# Patient Record
Sex: Male | Born: 1960 | Race: White | Hispanic: No | Marital: Married | Smoking: Never smoker
Health system: Southern US, Community
[De-identification: ages and names within clinical notes are randomized; demographics above are authoritative.]

## PROBLEM LIST (undated history)

## (undated) DIAGNOSIS — B182 Chronic viral hepatitis C: Secondary | ICD-10-CM

## (undated) DIAGNOSIS — E291 Testicular hypofunction: Secondary | ICD-10-CM

## (undated) DIAGNOSIS — G4700 Insomnia, unspecified: Secondary | ICD-10-CM

## (undated) DIAGNOSIS — J309 Allergic rhinitis, unspecified: Secondary | ICD-10-CM

## (undated) DIAGNOSIS — N400 Enlarged prostate without lower urinary tract symptoms: Secondary | ICD-10-CM

## (undated) DIAGNOSIS — N529 Male erectile dysfunction, unspecified: Secondary | ICD-10-CM

## (undated) DIAGNOSIS — I479 Paroxysmal tachycardia, unspecified: Secondary | ICD-10-CM

## (undated) DIAGNOSIS — E559 Vitamin D deficiency, unspecified: Secondary | ICD-10-CM

## (undated) DIAGNOSIS — I493 Ventricular premature depolarization: Secondary | ICD-10-CM

## (undated) HISTORY — DX: Male erectile dysfunction, unspecified: N52.9

## (undated) HISTORY — PX: SPLENECTOMY, TOTAL: SHX788

## (undated) HISTORY — DX: Allergic rhinitis, unspecified: J30.9

## (undated) HISTORY — DX: Paroxysmal tachycardia, unspecified: I47.9

## (undated) HISTORY — DX: Insomnia, unspecified: G47.00

## (undated) HISTORY — DX: Vitamin D deficiency, unspecified: E55.9

## (undated) HISTORY — DX: Ventricular premature depolarization: I49.3

## (undated) HISTORY — DX: Benign prostatic hyperplasia without lower urinary tract symptoms: N40.0

## (undated) HISTORY — DX: Testicular hypofunction: E29.1

## (undated) HISTORY — DX: Chronic viral hepatitis C: B18.2

---

## 2011-04-09 DIAGNOSIS — K754 Autoimmune hepatitis: Secondary | ICD-10-CM

## 2011-04-09 HISTORY — DX: Autoimmune hepatitis: K75.4

## 2012-10-08 DIAGNOSIS — T148XXA Other injury of unspecified body region, initial encounter: Secondary | ICD-10-CM

## 2012-10-08 DIAGNOSIS — Z9081 Acquired absence of spleen: Secondary | ICD-10-CM

## 2012-10-08 HISTORY — DX: Other injury of unspecified body region, initial encounter: T14.8XXA

## 2012-10-08 HISTORY — DX: Acquired absence of spleen: Z90.81

## 2019-08-03 ENCOUNTER — Encounter: Payer: Self-pay | Admitting: Cardiology

## 2019-08-03 ENCOUNTER — Other Ambulatory Visit: Payer: Self-pay

## 2019-08-03 ENCOUNTER — Ambulatory Visit: Payer: PRIVATE HEALTH INSURANCE | Admitting: Cardiology

## 2019-08-03 VITALS — BP 126/80 | HR 87 | Ht 73.0 in | Wt 215.8 lb

## 2019-08-03 DIAGNOSIS — D580 Hereditary spherocytosis: Secondary | ICD-10-CM

## 2019-08-03 DIAGNOSIS — D66 Hereditary factor VIII deficiency: Secondary | ICD-10-CM

## 2019-08-03 DIAGNOSIS — E785 Hyperlipidemia, unspecified: Secondary | ICD-10-CM

## 2019-08-03 DIAGNOSIS — I10 Essential (primary) hypertension: Secondary | ICD-10-CM | POA: Diagnosis not present

## 2019-08-03 DIAGNOSIS — I471 Supraventricular tachycardia, unspecified: Secondary | ICD-10-CM

## 2019-08-03 DIAGNOSIS — R0789 Other chest pain: Secondary | ICD-10-CM | POA: Insufficient documentation

## 2019-08-03 DIAGNOSIS — D751 Secondary polycythemia: Secondary | ICD-10-CM

## 2019-08-03 DIAGNOSIS — Z9081 Acquired absence of spleen: Secondary | ICD-10-CM

## 2019-08-03 DIAGNOSIS — E119 Type 2 diabetes mellitus without complications: Secondary | ICD-10-CM

## 2019-08-03 HISTORY — DX: Supraventricular tachycardia, unspecified: I47.10

## 2019-08-03 HISTORY — DX: Other chest pain: R07.89

## 2019-08-03 HISTORY — DX: Hyperlipidemia, unspecified: E78.5

## 2019-08-03 HISTORY — DX: Type 2 diabetes mellitus without complications: E11.9

## 2019-08-03 HISTORY — DX: Essential (primary) hypertension: I10

## 2019-08-03 HISTORY — DX: Hereditary factor VIII deficiency: D66

## 2019-08-03 HISTORY — DX: Hereditary spherocytosis: D58.0

## 2019-08-03 HISTORY — DX: Secondary polycythemia: D75.1

## 2019-08-03 HISTORY — DX: Supraventricular tachycardia: I47.1

## 2019-08-03 MED ORDER — METOPROLOL SUCCINATE ER 50 MG PO TB24
50.0000 mg | ORAL_TABLET | Freq: Every day | ORAL | 1 refills | Status: DC
Start: 2019-08-03 — End: 2020-01-27

## 2019-08-03 NOTE — Patient Instructions (Signed)
Medication Instructions:  Your physician has recommended you make the following change in your medication:   STOP: Metoprolol tartrate   START: Metoprolol succinate 50 mg daily  *If you need a refill on your cardiac medications before your next appointment, please call your pharmacy*   Lab Work: None.  If you have labs (blood work) drawn today and your tests are completely normal, you will receive your results only by: Marland Kitchen. MyChart Message (if you have MyChart) OR . A paper copy in the mail If you have any lab test that is abnormal or we need to change your treatment, we will call you to review the results.   Testing/Procedures: Your physician has requested that you have an echocardiogram. Echocardiography is a painless test that uses sound waves to create images of your heart. It provides your doctor with information about the size and shape of your heart and how well your heart's chambers and valves are working. This procedure takes approximately one hour. There are no restrictions for this procedure.    [facility] [address] [place], [place] [address] Phone:  [phone]    Please arrive 15 minutes prior to your appointment time for registration and insurance purposes.  The test will take approximately 3 to 4 hours to complete; you may bring reading material.  If someone comes with you to your appointment, they will need to remain in the main lobby due to limited space in the testing area. **If you are pregnant or breastfeeding, please notify the nuclear lab prior to your appointment**  How to prepare for your Myocardial Perfusion Test: . Do not eat or drink 3 hours prior to your test, except you may have water. . Do not consume products containing caffeine (regular or decaffeinated) 12 hours prior to your test. (ex: coffee, chocolate, sodas, tea). . Do bring a list of your current medications with you.  If not listed below, you may take your  medications as normal. . HOLD Erectile dysfunction medication: sildenafil 48 hours before, HOLD diabetic medication/insulin the morning of the test: farxiga, actos, and janumet,  . Do wear comfortable clothes (no dresses or overalls) and walking shoes, tennis shoes preferred (No heels or open toe shoes are allowed). . Do NOT wear cologne, perfume, aftershave, or lotions (deodorant is allowed). . If these instructions are not followed, your test will have to be rescheduled.  Please report to [address] for your test.  If you have questions or concerns about your appointment, you can call the [facility] at [phone].  If you cannot keep your appointment, please provide 24 hours notification to the Nuclear Lab, to avoid a possible $50 charge to your account.    Follow-Up: At [facility], you and your health needs are our priority.  As part of our continuing mission to provide you with exceptional heart care, we have created designated Provider Care Teams.  These Care Teams include your primary Cardiologist (physician) and Advanced Practice Providers (APPs -  Physician Assistants and Nurse Practitioners) who all work together to provide you with the care you need, when you need it.  We recommend signing up for the patient portal called "MyChart".  Sign up information is provided on this After Visit Summary.  MyChart is used to connect with patients for Virtual Visits (Telemedicine).  Patients are able to view lab/test results, encounter notes, upcoming appointments, etc.  Non-urgent messages can be sent to your provider as well.  To learn more about what you can do with MyChart, go to [email or web address].    Your next appointment:   6 week(s)  The format for your next appointment:   In Person  Provider:   Jenne Campus, MD   Other Instructions   Echocardiogram An echocardiogram is a procedure that uses painless sound waves  (ultrasound) to produce an image of the heart. Images from an echocardiogram can provide important information about:  Signs of coronary artery disease (CAD).  Aneurysm detection. An aneurysm is a weak or damaged part of an artery wall that bulges out from the normal force of blood pumping through the body.  Heart size and shape. Changes in the size or shape of the heart can be associated with certain conditions, including heart failure, aneurysm, and CAD.  Heart muscle function.  Heart valve function.  Signs of a past heart attack.  Fluid buildup around the heart.  Thickening of the heart muscle.  A tumor or infectious growth around the heart valves. Tell a health care provider about:  Any allergies you have.  All medicines you are taking, including vitamins, herbs, eye drops, creams, and over-the-counter medicines.  Any blood disorders you have.  Any surgeries you have had.  Any medical conditions you have.  Whether you are pregnant or may be pregnant. What are the risks? Generally, this is a safe procedure. However, problems may occur, including:  Allergic reaction to dye (contrast) that may be used during the procedure. What happens before the procedure? No specific preparation is needed. You may eat and drink normally. What happens during the procedure?   An IV tube may be inserted into one of your veins.  You may receive contrast through this tube. A contrast is an injection that improves the quality of the pictures from your heart.  A gel will be applied to your chest.  A wand-like tool (transducer) will be moved over your chest. The gel will help to transmit the sound waves from the transducer.  The sound waves will harmlessly bounce off of your heart to allow the heart images to be captured in real-time motion. The images will be recorded on a computer. The procedure may vary among health care providers and hospitals. What happens after the  procedure?  You may return to your normal, everyday life, including diet, activities, and medicines, unless your health care provider tells you not to do that. Summary  An echocardiogram is a procedure that uses painless sound waves (ultrasound) to produce an image of the heart.  Images from an echocardiogram can provide important information about the size and shape of your heart, heart muscle function, heart valve function, and fluid buildup around your heart.  You do not need to do anything to prepare before this procedure. You may eat and drink normally.  After the echocardiogram is completed, you may return to your normal, everyday life, unless your health care provider tells you not to do that. This information is not intended to replace advice given to you by your health care provider. Make sure you discuss any questions you have with your health care provider. Document Revised: 07/10/2018 Document Reviewed: 04/21/2016 Elsevier Patient Education  2020 [place].   Cardiac Nuclear Scan A cardiac nuclear scan is a test that measures blood flow to the heart when a person is resting and when he or she is exercising. The test looks for problems such as:  Not enough blood reaching a portion of the heart.  The  heart muscle not working normally. You may need this test if:  You have heart disease.  You have had abnormal lab results.  You have had heart surgery or a balloon procedure to open up blocked arteries (angioplasty).  You have chest pain.  You have shortness of breath. In this test, a radioactive dye (tracer) is injected into your bloodstream. After the tracer has traveled to your heart, an imaging device is used to measure how much of the tracer is absorbed by or distributed to various areas of your heart. This procedure is usually done at a hospital and takes 2-4 hours. Tell a health care provider about:  Any allergies you have.  All medicines you are taking,  including vitamins, herbs, eye drops, creams, and over-the-counter medicines.  Any problems you or family members have had with anesthetic medicines.  Any blood disorders you have.  Any surgeries you have had.  Any medical conditions you have.  Whether you are pregnant or may be pregnant. What are the risks? Generally, this is a safe procedure. However, problems may occur, including:  Serious chest pain and heart attack. This is only a risk if the stress portion of the test is done.  Rapid heartbeat.  Sensation of warmth in your chest. This usually passes quickly.  Allergic reaction to the tracer. What happens before the procedure?  Ask your health care provider about changing or stopping your regular medicines. This is especially important if you are taking diabetes medicines or blood thinners.  Follow instructions from your health care provider about eating or drinking restrictions.  Remove your jewelry on the day of the procedure. What happens during the procedure?  An IV will be inserted into one of your veins.  Your health care provider will inject a small amount of radioactive tracer through the IV.  You will wait for 20-40 minutes while the tracer travels through your bloodstream.  Your heart activity will be monitored with an electrocardiogram (ECG).  You will lie down on an exam table.  Images of your heart will be taken for about 15-20 minutes.  You may also have a stress test. For this test, one of the following may be done: ? You will exercise on a treadmill or stationary bike. While you exercise, your heart's activity will be monitored with an ECG, and your blood pressure will be checked. ? You will be given medicines that will increase blood flow to parts of your heart. This is done if you are unable to exercise.  When blood flow to your heart has peaked, a tracer will again be injected through the IV.  After 20-40 minutes, you will get back on the exam  table and have more images taken of your heart.  Depending on the type of tracer used, scans may need to be repeated 3-4 hours later.  Your IV line will be removed when the procedure is over. The procedure may vary among health care providers and hospitals. What happens after the procedure?  Unless your health care provider tells you otherwise, you may return to your normal schedule, including diet, activities, and medicines.  Unless your health care provider tells you otherwise, you may increase your fluid intake. This will help to flush the contrast dye from your body. Drink enough fluid to keep your urine pale yellow.  Ask your health care provider, or the department that is doing the test: ? When will my results be ready? ? How will I get my results? Summary  A cardiac nuclear scan measures the blood flow to the heart when a person is resting and when he or she is exercising.  Tell your health care provider if you are pregnant.  Before the procedure, ask your health care provider about changing or stopping your regular medicines. This is especially important if you are taking diabetes medicines or blood thinners.  After the procedure, unless your health care provider tells you otherwise, increase your fluid intake. This will help flush the contrast dye from your body.  After the procedure, unless your health care provider tells you otherwise, you may return to your normal schedule, including diet, activities, and medicines. This information is not intended to replace advice given to you by your health care provider. Make sure you discuss any questions you have with your health care provider. Document Revised: 09/02/2017 Document Reviewed: 09/02/2017 Elsevier Patient Education  2020 [employer].  Metoprolol Extended-Release Tablets What is this medicine? METOPROLOL (me TOE proe lole) is a beta blocker. It decreases the amount of work your heart has to do and helps your heart beat  regularly. It treats high blood pressure and/or prevent chest pain (also called angina). It also treats heart failure. This medicine may be used for other purposes; ask your health care provider or pharmacist if you have questions. COMMON BRAND NAME(S): toprol, Toprol XL What should I tell my health care provider before I take this medicine? They need to know if you have any of these conditions:  diabetes  heart or vessel disease like slow heart rate, worsening heart failure, heart block, sick sinus syndrome or Raynaud's disease  kidney disease  liver disease  lung or breathing disease, like asthma or emphysema  pheochromocytoma  thyroid disease  an unusual or allergic reaction to metoprolol, other beta-blockers, medicines, foods, dyes, or preservatives  pregnant or trying to get pregnant  breast-feeding How should I use this medicine? Take this drug by mouth. Take it as directed on the prescription label at the same time every day. Take it with food. You may cut the tablet in half if it is scored (has a line in the middle of it). This may help you swallow the tablet if the whole tablet is too big. Be sure to take both halves. Do not take just one-half of the tablet. Keep taking it unless your health care provider tells you to stop. Talk to your health care provider about the use of this drug in children. While it may be prescribed for children as young as 6 for selected conditions, precautions do apply. Overdosage: If you think you have taken too much of this medicine contact a poison control center or emergency room at once. NOTE: This medicine is only for you. Do not share this medicine with others. What if I miss a dose? If you miss a dose, take it as soon as you can. If it is almost time for your next dose, take only that dose. Do not take double or extra doses. What may interact with this medicine? This medicine may interact with the following medications:  certain medicines  for blood pressure, heart disease, irregular heart beat  certain medicines for depression, like monoamine oxidase (MAO) inhibitors, fluoxetine, or paroxetine  clonidine  dobutamine  epinephrine  isoproterenol  reserpine This list may not describe all possible interactions. Give your health care provider a list of all the medicines, herbs, non-prescription drugs, or dietary supplements you use. Also tell them if you smoke, drink alcohol, or use  illegal drugs. Some items may interact with your medicine. What should I watch for while using this medicine? Visit your doctor or health care professional for regular check ups. Contact your doctor right away if your symptoms worsen. Check your blood pressure and pulse rate regularly. Ask your health care professional what your blood pressure and pulse rate should be, and when you should contact them. You may get drowsy or dizzy. Do not drive, use machinery, or do anything that needs mental alertness until you know how this medicine affects you. Do not sit or stand up quickly, especially if you are an older patient. This reduces the risk of dizzy or fainting spells. Contact your doctor if these symptoms continue. Alcohol may interfere with the effect of this medicine. Avoid alcoholic drinks. This medicine may increase blood sugar. Ask your healthcare provider if changes in diet or medicines are needed if you have diabetes. What side effects may I notice from receiving this medicine? Side effects that you should report to your doctor or health care professional as soon as possible:  allergic reactions like skin rash, itching or hives  cold or numb hands or feet  depression  difficulty breathing  faint  fever with sore throat  irregular heartbeat, chest pain  rapid weight gain   signs and symptoms of high blood sugar such as being more thirsty or hungry or having to urinate more than normal. You may also feel very tired or have blurry  vision.  swollen legs or ankles Side effects that usually do not require medical attention (report to your doctor or health care professional if they continue or are bothersome):  anxiety or nervousness  change in sex drive or performance  dry skin  headache  nightmares or trouble sleeping  short term memory loss  stomach upset or diarrhea This list may not describe all possible side effects. Call your doctor for medical advice about side effects. You may report side effects to FDA at 1-800-FDA-1088. Where should I keep my medicine? Keep out of the reach of children and pets. Store at room temperature between 20 and 25 degrees C (68 and 77 degrees F). Throw away any unused drug after the expiration date. NOTE: This sheet is a summary. It may not cover all possible information. If you have questions about this medicine, talk to your doctor, pharmacist, or health care provider.  2020 Elsevier/Gold Standard (2018-10-30 18:23:00)

## 2019-08-03 NOTE — Addendum Note (Signed)
Addended by: Vanessa Frohna R on: 08/03/2019 11:06 AM   Modules accepted: Orders

## 2019-08-03 NOTE — Progress Notes (Signed)
Cardiology Consultation:    Date:  08/03/2019   ID:  Samuel Dunn, DOB 02/17/1961, MRN [id number]  PCP:  Abner Greenspan, MD  Cardiologist:  Gypsy Balsam, MD   Referring MD: No ref. provider found   No chief complaint on file. Palpitations  History of Present Illness:    Samuel Dunn is a 59 y.o. male who is being seen today for the evaluation of SVT at the request of No ref. provider found.  Recently he was seen in the emergency room from the hospital because of palpitations.  He was found to have showed RP interval supraventricular tachycardia and he was referred to has for follow-up.  He described only one episode of palpitations apparently he was at work started feeling dizzy woozy he went to his nurse working was fine to be tachycardiac rate of 198.  He was eventually transferred to [facility].  He converted to sinus rhythm.  His baseline he was noted morphology is right bundle branch block.  Since that time he denies have any palpitations..  His past medical history is also significant for essential hypertension, she overall feels well.  Essential hypertension, dyslipidemia.  He described to have some chest tightness that happened in different situations.  Situation including regular exercise.  There is a question very worrisome. Past Medical History:  Diagnosis Date  . Allergic rhinitis   . 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 Benign prostatic hyperplasia without lower urinary tract symptoms   . Chronic hepatitis C (HCC)   . Diabetes mellitus (HCC) 08/03/2019  . ED (erectile dysfunction)   . Erythrocytosis 08/03/2019  . Hematoma 10/08/2012  . 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 Hereditary spherocytosis (HCC) 08/03/2019  . Hyperlipidemia 08/03/2019  . Hypertension 08/03/2019  . Hypogonadism male   . Insomnia   . Paroxysmal tachycardia, unspecified (HCC)   . 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 Ventricular premature depolarization   . Vitamin D deficiency       Current Medications: Current  Meds  Medication Sig  . albuterol (VENTOLIN HFA) 108 (90 Base) MCG/ACT inhaler Inhale 2 puffs into the lungs every 4 (four) hours as needed for wheezing or shortness of breath.  . azaTHIOprine (IMURAN) 50 MG tablet Take 50 mg by mouth daily.  Marland Kitchen azaTHIOprine (IMURAN) 50 MG tablet Take 50 mg by mouth daily.  Marland Kitchen azelastine (ASTELIN) 0.1 % nasal spray Place 2 sprays into both nostrils 2 (two) times daily.  . citalopram (CELEXA) 10 MG tablet Take 10 mg by mouth daily.  . citalopram (CELEXA) 10 MG tablet Take 10 mg by mouth daily.  . Continuous Blood Gluc Sensor (FREESTYLE LIBRE 14 DAY SENSOR) MISC change every 14 days  . dapagliflozin propanediol (FARXIGA) 10 MG TABS tablet Take 10 mg by mouth daily.  Marland Kitchen FARXIGA 10 MG TABS tablet Take 10 mg by mouth daily.  . fenofibrate 160 MG tablet Take 160 mg by mouth daily.  . fenofibrate 160 MG tablet Take 160 mg by mouth daily.  . fluticasone (FLONASE) 50 MCG/ACT nasal spray Place 1 spray into both nostrils daily.  . fluticasone (FLONASE) 50 MCG/ACT nasal spray Place into both nostrils 2 (two) times daily.  Marland Kitchen guaifenesin (ROBITUSSIN) 100 MG/5ML syrup Take 200 mg by mouth as needed for cough.  . icosapent Ethyl (VASCEPA) 1 g capsule Take 2 g by mouth 2 (two) times daily.  Marland Kitchen levocetirizine (XYZAL) 5 MG tablet 5 mg every evening. As Needed  . levocetirizine (XYZAL) 5 MG tablet Take 5 mg by mouth every  evening.  . metoprolol tartrate (LOPRESSOR) 25 MG tablet Take 25 mg by mouth 2 (two) times daily.  . metoprolol tartrate (LOPRESSOR) 25 MG tablet Take 25 mg by mouth 2 (two) times daily.  . Omega-3 Fatty Acids (FISH OIL) 1000 MG CAPS Take by mouth 2 (two) times daily.  . pioglitazone (ACTOS) 15 MG tablet Take 15 mg by mouth daily.  . pioglitazone (ACTOS) 15 MG tablet Take 15 mg by mouth daily.  . rosuvastatin (CRESTOR) 20 MG tablet Take 20 mg by mouth daily.  . rosuvastatin (CRESTOR) 20 MG tablet Take 20 mg by mouth daily.  . sildenafil (REVATIO) 20 MG tablet  Take 20 mg by mouth as needed (Take 1-4 tablets 1 hour before activity as needed).  . sitaGLIPtin-metformin (JANUMET) 50-1000 MG tablet Take 1 tablet by mouth 2 (two) times daily with a meal.  . SitaGLIPtin-MetFORMIN HCl 50-1000 MG TB24 Take by mouth in the morning and at bedtime.  . tamsulosin (FLOMAX) 0.4 MG CAPS capsule Take 0.4 mg by mouth daily.  Marland Kitchen telmisartan-hydrochlorothiazide (MICARDIS HCT) 80-12.5 MG tablet Take 1 tablet by mouth daily.  Marland Kitchen telmisartan-hydrochlorothiazide (MICARDIS HCT) 80-12.5 MG tablet Take 1 tablet by mouth daily.  Marland Kitchen VASCEPA 1 g capsule Take 2 capsules (2,000 mg) by mouth 2 times per day with food  . Vitamin D, Ergocalciferol, (DRISDOL) 1.25 MG (50000 UNIT) CAPS capsule Take 50,000 Units by mouth once a week.     Allergies:   Patient has no known allergies.   Social History   Socioeconomic History  . Marital status: Married    Spouse name: Not on file  . Number of children: Not on file  . Years of education: Not on file  . Highest education level: Not on file  Occupational History  . Not on file  Tobacco Use  . Smoking status: Never Smoker  . Smokeless tobacco: Never Used  Substance and Sexual Activity  . Alcohol use: Never  . Drug use: Not on file  . Sexual activity: Not on file  Other Topics Concern  . Not on file  Social History Narrative  . Not on file   Social Determinants of Health   Financial Resource Strain:   . Difficulty of Paying Living Expenses:   Food Insecurity:   . Worried About Charity fundraiser in the Last Year:   . Arboriculturist in the Last Year:   Transportation Needs:   . Film/video editor (Medical):   Marland Kitchen Lack of Transportation (Non-Medical):   Physical Activity:   . Days of Exercise per Week:   . Minutes of Exercise per Session:   Stress:   . Feeling of Stress :   Social Connections:   . Frequency of Communication with Friends and Family:   . Frequency of Social Gatherings with Friends and Family:   .  Attends Religious Services:   . Active Member of Clubs or Organizations:   . Attends Archivist Meetings:   Marland Kitchen Marital Status:      Family History: The patient's family history is not on file. ROS:   Please see the history of present illness.    All 14 point review of systems negative except as described per history of present illness.  EKGs/Labs/Other Studies Reviewed:    The following studies were reviewed today: Records from [facility] reviewed.  His EKG during the tachycardia showed short RP tachycardia with baseline right bundle branch block.  However his EKG from baseline also showed  right bundle branch block.  This is supraventricular tachycardia.  EKG:  EKG is  ordered today.  The ekg ordered today demonstrates normal sinus rhythm normal intervals right bundle branch block left axis deviation nonspecific ST segment changes  Recent Labs: No results found for requested labs within last 8760 hours.  Recent Lipid Panel No results found for: CHOL, TRIG, HDL, CHOLHDL, VLDL, LDLCALC, LDLDIRECT  Physical Exam:    VS:  BP 126/80   Pulse 87   Ht 6\' 1"  (1.854 m)   Wt 215 lb 12.8 oz (97.9 kg)   SpO2 95%   BMI 28.47 kg/m     Wt Readings from Last 3 Encounters:  08/03/19 215 lb 12.8 oz (97.9 kg)     GEN:  Well nourished, well developed in no acute distress HEENT: Normal NECK: No JVD; No carotid bruits LYMPHATICS: No lymphadenopathy CARDIAC: RRR, no murmurs, no rubs, no gallops RESPIRATORY:  Clear to auscultation without rales, wheezing or rhonchi  ABDOMEN: Soft, non-tender, non-distended MUSCULOSKELETAL:  No edema; No deformity  SKIN: Warm and dry NEUROLOGIC:  Alert and oriented x 3 PSYCHIATRIC:  Normal affect   ASSESSMENT:    1. Essential hypertension   2. SVT (supraventricular tachycardia) (HCC)   3. Atypical chest pain   4. Hereditary spherocytosis (HCC)   5. S/P splenectomy   6. Dyslipidemia    PLAN:    In order of problems listed  above:  1. Supraventricular tachycardia.  He has been drinking a lot of caffeinated drinks.  He started also he was put on metoprolol 25 mg twice daily by emergency room since that time there is no more arrhythmia.  I think we have this problem under control. 2. Atypical chest pain with multiple risk factors for coronary artery disease.  I will ask him to have an echocardiogram to assess left ventricle ejection fraction.  He will also be scheduled to have Lexiscan to rule out ischemia. 3. Dyslipidemia: He is on multiple medications.  He will call his primary care physician to get fasting lipid profile. 4. Status post splenectomy, hereditary spherocytosis.  Follow-up by GI specialist.  We had a long discussion about his arrhythmia told him this is a benign phenomenon.  Quitting and drinking caffeinated drinks definitely will help.  I will also switch him to long-acting form of beta-blocker.  Stress test and echocardiogram will be done.   Medication Adjustments/Labs and Tests Ordered: Current medicines are reviewed at length with the patient today.  Concerns regarding medicines are outlined above.  No orders of the defined types were placed in this encounter.  No orders of the defined types were placed in this encounter.   Signed, 10/03/19, MD, [facility]. 08/03/2019 10:48 AM    [facility]

## 2019-08-04 ENCOUNTER — Telehealth (HOSPITAL_COMMUNITY): Payer: Self-pay | Admitting: *Deleted

## 2019-08-04 NOTE — Telephone Encounter (Signed)
Patient given detailed instructions per Myocardial Perfusion Study Information Sheet for the test on 08/06/19 at 8:15. Patient notified to arrive 15 minutes early and that it is imperative to arrive on time for appointment to keep from having the test rescheduled.  If you need to cancel or reschedule your appointment, please call the office within 24 hours of your appointment. . Patient verbalized understanding.Daneil Dolin

## 2019-08-05 ENCOUNTER — Ambulatory Visit (INDEPENDENT_AMBULATORY_CARE_PROVIDER_SITE_OTHER): Payer: PRIVATE HEALTH INSURANCE

## 2019-08-05 ENCOUNTER — Other Ambulatory Visit: Payer: Self-pay

## 2019-08-05 DIAGNOSIS — R0789 Other chest pain: Secondary | ICD-10-CM | POA: Diagnosis not present

## 2019-08-05 DIAGNOSIS — I10 Essential (primary) hypertension: Secondary | ICD-10-CM | POA: Diagnosis not present

## 2019-08-05 DIAGNOSIS — I471 Supraventricular tachycardia: Secondary | ICD-10-CM | POA: Diagnosis not present

## 2019-08-05 NOTE — Progress Notes (Signed)
Complete echocardiogram has been performed.  Jimmy Xoie Kreuser RDCS, RVT 

## 2019-08-06 ENCOUNTER — Ambulatory Visit (INDEPENDENT_AMBULATORY_CARE_PROVIDER_SITE_OTHER): Payer: PRIVATE HEALTH INSURANCE

## 2019-08-06 VITALS — Ht 73.0 in | Wt 215.0 lb

## 2019-08-06 DIAGNOSIS — I471 Supraventricular tachycardia: Secondary | ICD-10-CM

## 2019-08-06 DIAGNOSIS — R0789 Other chest pain: Secondary | ICD-10-CM

## 2019-08-06 DIAGNOSIS — I10 Essential (primary) hypertension: Secondary | ICD-10-CM

## 2019-08-06 LAB — MYOCARDIAL PERFUSION IMAGING
LV dias vol: 118 mL (ref 62–150)
LV sys vol: 54 mL
Peak HR: 89 {beats}/min
Rest HR: 66 {beats}/min
SDS: 2
SRS: 3
SSS: 5
TID: 1.1

## 2019-08-06 MED ORDER — TECHNETIUM TC 99M TETROFOSMIN IV KIT
27.9000 | PACK | Freq: Once | INTRAVENOUS | Status: AC | PRN
  Administered 2019-08-06: 27.9 via INTRAVENOUS

## 2019-08-06 MED ORDER — REGADENOSON 0.4 MG/5ML IV SOLN
0.4000 mg | Freq: Once | INTRAVENOUS | Status: AC
  Administered 2019-08-06: 0.4 mg via INTRAVENOUS

## 2019-08-06 MED ORDER — TECHNETIUM TC 99M TETROFOSMIN IV KIT
11.0000 | PACK | Freq: Once | INTRAVENOUS | Status: AC | PRN
Start: 2019-08-06 — End: 2019-08-06
  Administered 2019-08-06: 11 via INTRAVENOUS

## 2019-08-18 ENCOUNTER — Encounter: Payer: Self-pay | Admitting: Cardiology

## 2019-08-18 ENCOUNTER — Ambulatory Visit: Payer: PRIVATE HEALTH INSURANCE | Admitting: Cardiology

## 2019-08-18 ENCOUNTER — Other Ambulatory Visit: Payer: Self-pay

## 2019-08-18 VITALS — BP 168/70 | HR 70 | Ht 73.0 in | Wt 215.0 lb

## 2019-08-18 DIAGNOSIS — R0789 Other chest pain: Secondary | ICD-10-CM

## 2019-08-18 DIAGNOSIS — I471 Supraventricular tachycardia: Secondary | ICD-10-CM

## 2019-08-18 DIAGNOSIS — I10 Essential (primary) hypertension: Secondary | ICD-10-CM

## 2019-08-18 DIAGNOSIS — R9439 Abnormal result of other cardiovascular function study: Secondary | ICD-10-CM

## 2019-08-18 HISTORY — DX: Abnormal result of other cardiovascular function study: R94.39

## 2019-08-18 MED ORDER — NITROGLYCERIN 0.4 MG SL SUBL: 0.4000 mg | SUBLINGUAL_TABLET | SUBLINGUAL | 11 refills | Status: AC | PRN

## 2019-08-18 MED ORDER — RANOLAZINE ER 500 MG PO TB12
500.0000 mg | ORAL_TABLET | Freq: Two times a day (BID) | ORAL | 2 refills | Status: DC
Start: 2019-08-18 — End: 2019-11-03

## 2019-08-18 NOTE — Progress Notes (Signed)
Cardiology Office Note:    Date:  08/18/2019   ID:  Samuel Dunn, DOB [birth year], MRN [id number]  PCP:  Abner Greenspan, MD  Cardiologist:  Gypsy Balsam, MD    Referring MD: Abner Greenspan, MD   Chief Complaint  Patient presents with  . Follow-up    Discuss Stress Test Results     History of Present Illness:    Samuel Dunn is a 59 y.o. male with past medical history significant for diabetes, spherocytosis, status post splenectomy, diabetes, dyslipidemia, essential hypertension.  Initially he was seen because of palpitations he was identified to have SVT which is successfully managed with medications however because of atypical chest pains and multiple risk factors for coronary artery disease stress test has been done stress test showed ischemia involving apical portion of the inferior and lateral wall.  He is here today to talk about it.  Still described to have some chest pain but those are not related to exercise.  He is able to walk climb stairs working the garden he planted about 50 tomato plants and have no difficulty doing it.  Pain can happen independently of exercise last for few minutes pressure-like sensation.  Past Medical History:  Diagnosis Date  . Allergic rhinitis   . 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 Benign prostatic hyperplasia without lower urinary tract symptoms   . Chronic hepatitis C (HCC)   . Diabetes mellitus (HCC) 08/03/2019  . ED (erectile dysfunction)   . Erythrocytosis 08/03/2019  . Hematoma 10/08/2012  . 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 Hereditary spherocytosis (HCC) 08/03/2019  . Hyperlipidemia 08/03/2019  . Hypertension 08/03/2019  . Hypogonadism male   . Insomnia   . Paroxysmal tachycardia, unspecified (HCC)   . 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 Ventricular premature depolarization   . Vitamin D deficiency       Current Medications: Current Meds  Medication Sig  . azaTHIOprine (IMURAN) 50 MG tablet Take 50 mg by mouth daily.  Marland Kitchen azaTHIOprine (IMURAN) 50 MG  tablet Take 50 mg by mouth daily.  Marland Kitchen azelastine (ASTELIN) 0.1 % nasal spray Place 2 sprays into both nostrils 2 (two) times daily.  . citalopram (CELEXA) 10 MG tablet Take 10 mg by mouth daily.  . citalopram (CELEXA) 10 MG tablet Take 10 mg by mouth daily.  . Continuous Blood Gluc Sensor (FREESTYLE LIBRE 14 DAY SENSOR) MISC change every 14 days  . dapagliflozin propanediol (FARXIGA) 10 MG TABS tablet Take 10 mg by mouth daily.  Marland Kitchen FARXIGA 10 MG TABS tablet Take 10 mg by mouth daily.  . fenofibrate 160 MG tablet Take 160 mg by mouth daily.  . fenofibrate 160 MG tablet Take 160 mg by mouth daily.  . fluticasone (FLONASE) 50 MCG/ACT nasal spray Place 1 spray into both nostrils daily.  . fluticasone (FLONASE) 50 MCG/ACT nasal spray Place into both nostrils 2 (two) times daily.  Marland Kitchen guaifenesin (ROBITUSSIN) 100 MG/5ML syrup Take 200 mg by mouth as needed for cough.  . hydrochlorothiazide (HYDRODIURIL) 12.5 MG tablet Take 12.5 mg by mouth daily.  Marland Kitchen icosapent Ethyl (VASCEPA) 1 g capsule Take 2 g by mouth 2 (two) times daily.  Marland Kitchen levocetirizine (XYZAL) 5 MG tablet 5 mg every evening. As Needed  . levocetirizine (XYZAL) 5 MG tablet Take 5 mg by mouth every evening.  . metoprolol succinate (TOPROL-XL) 50 MG 24 hr tablet Take 1 tablet (50 mg total) by mouth daily. Take with or immediately following a meal.  . Omega-3 Fatty Acids (FISH OIL) 1000 MG  CAPS Take by mouth 2 (two) times daily.  . pioglitazone (ACTOS) 15 MG tablet Take 15 mg by mouth daily.  . pioglitazone (ACTOS) 15 MG tablet Take 15 mg by mouth daily.  . rosuvastatin (CRESTOR) 20 MG tablet Take 20 mg by mouth daily.  . rosuvastatin (CRESTOR) 20 MG tablet Take 20 mg by mouth daily.  . sildenafil (REVATIO) 20 MG tablet Take 20 mg by mouth as needed (Take 1-4 tablets 1 hour before activity as needed).  . sitaGLIPtin-metformin (JANUMET) 50-1000 MG tablet Take 1 tablet by mouth 2 (two) times daily with a meal.  . SitaGLIPtin-MetFORMIN HCl 50-1000 MG  TB24 Take by mouth in the morning and at bedtime.  . tamsulosin (FLOMAX) 0.4 MG CAPS capsule Take 0.4 mg by mouth daily.  Marland Kitchen telmisartan (MICARDIS) 80 MG tablet Take 80 mg by mouth daily.  Marland Kitchen telmisartan-hydrochlorothiazide (MICARDIS HCT) 80-12.5 MG tablet Take 1 tablet by mouth daily.  Marland Kitchen telmisartan-hydrochlorothiazide (MICARDIS HCT) 80-12.5 MG tablet Take 1 tablet by mouth daily.  Marland Kitchen VASCEPA 1 g capsule Take 2 capsules (2,000 mg) by mouth 2 times per day with food  . Vitamin D, Ergocalciferol, (DRISDOL) 1.25 MG (50000 UNIT) CAPS capsule Take 50,000 Units by mouth once a week.     Allergies:   Patient has no known allergies.   Social History   Socioeconomic History  . Marital status: Married    Spouse name: Not on file  . Number of children: Not on file  . Years of education: Not on file  . Highest education level: Not on file  Occupational History  . Not on file  Tobacco Use  . Smoking status: Never Smoker  . Smokeless tobacco: Never Used  Substance and Sexual Activity  . Alcohol use: Never  . Drug use: Not on file  . Sexual activity: Not on file  Other Topics Concern  . Not on file  Social History Narrative  . Not on file   Social Determinants of Health   Financial Resource Strain:   . Difficulty of Paying Living Expenses:   Food Insecurity:   . Worried About Programme researcher, broadcasting/film/video in the Last Year:   . Barista in the Last Year:   Transportation Needs:   . Freight forwarder (Medical):   Marland Kitchen Lack of Transportation (Non-Medical):   Physical Activity:   . Days of Exercise per Week:   . Minutes of Exercise per Session:   Stress:   . Feeling of Stress :   Social Connections:   . Frequency of Communication with Friends and Family:   . Frequency of Social Gatherings with Friends and Family:   . Attends Religious Services:   . Active Member of Clubs or Organizations:   . Attends Banker Meetings:   Marland Kitchen Marital Status:      Family History: The  patient's family history is not on file. ROS:   Please see the history of present illness.    All 14 point review of systems negative except as described per history of present illness  EKGs/Labs/Other Studies Reviewed:      Recent Labs: No results found for requested labs within last 8760 hours.  Recent Lipid Panel No results found for: CHOL, TRIG, HDL, CHOLHDL, VLDL, LDLCALC, LDLDIRECT  Physical Exam:    VS:  BP (!) 168/70   Pulse 70   Ht 6\' 1"  (1.854 m)   Wt 215 lb (97.5 kg)   SpO2 96%   BMI 28.37  kg/m     Wt Readings from Last 3 Encounters:  08/18/19 215 lb (97.5 kg)  08/06/19 215 lb (97.5 kg)  08/03/19 215 lb 12.8 oz (97.9 kg)     GEN:  Well nourished, well developed in no acute distress HEENT: Normal NECK: No JVD; No carotid bruits LYMPHATICS: No lymphadenopathy CARDIAC: RRR, no murmurs, no rubs, no gallops RESPIRATORY:  Clear to auscultation without rales, wheezing or rhonchi  ABDOMEN: Soft, non-tender, non-distended MUSCULOSKELETAL:  No edema; No deformity  SKIN: Warm and dry LOWER EXTREMITIES: no swelling NEUROLOGIC:  Alert and oriented x 3 PSYCHIATRIC:  Normal affect   ASSESSMENT:    1. Abnormal stress test   2. SVT (supraventricular tachycardia) ([place])   3. Essential hypertension   4. Atypical chest pain    PLAN:    In order of problems listed above:  1. Abnormal stress test showing small area of ischemia involving apical inferior as well as lateral wall indicating possibility of distal disease.  We did talk about an option for this situation.  Option being cardiac catheterization versus medical therapy.  He favors medical therapy, therefore, I will put him on ranolazine 500 mg twice daily and see how he does.  I told him if pain became more frequent he need to let me know.  I will also give her prescription for nitroglycerin with instruction to take it on as-needed basis but I also want him if he takes Viagra he cannot take nitroglycerin within 24  hours.  We will continue potential discussion about cardiac catheterization. 2. SVT: Denies having any palpitations continue present management. 3. Essential hypertension elevated today but he said he checked at home and always good.  We will continue monitoring. 4. Dyslipidemia he is on 20 mg Crestor which I will continue.  I cannot see chart over the next few weeks to see how situation involved.  He favor medical therapy but if he became unstable then cardiac catheterization will be considered.   Medication Adjustments/Labs and Tests Ordered: Current medicines are reviewed at length with the patient today.  Concerns regarding medicines are outlined above.  No orders of the defined types were placed in this encounter.  Medication changes:  Meds ordered this encounter  Medications  . ranolazine (RANEXA) 500 MG 12 hr tablet    Sig: Take 1 tablet (500 mg total) by mouth 2 (two) times daily.    Dispense:  60 tablet    Refill:  2  . nitroGLYCERIN (NITROSTAT) 0.4 MG SL tablet    Sig: Place 1 tablet (0.4 mg total) under the tongue every 5 (five) minutes as needed for chest pain.    Dispense:  25 tablet    Refill:  11    Signed, Park Liter, MD, [facility] 08/18/2019 9:30 AM    [place]

## 2019-08-18 NOTE — Patient Instructions (Signed)
Medication Instructions:  Your physician has recommended you make the following change in your medication:   START: Ranexa 500 mg twice daily   TAKE AS NEEDED:  Nitroglycerin 0.4 mg sublingual (under your tongue) as needed for chest pain. If experiencing chest pain, stop what you are doing and sit down. Take 1 nitroglycerin and wait 5 minutes. If chest pain continues, take another nitroglycerin and wait 5 minutes. If chest pain does not subside, take 1 more nitroglycerin and dial 911. You make take a total of 3 nitroglycerin in a 15 minute time frame.    *If you need a refill on your cardiac medications before your next appointment, please call your pharmacy*   Lab Work: None.  If you have labs (blood work) drawn today and your tests are completely normal, you will receive your results only by: Marland Kitchen MyChart Message (if you have MyChart) OR . A paper copy in the mail If you have any lab test that is abnormal or we need to change your treatment, we will call you to review the results.   Testing/Procedures: None.    Follow-Up: At [facility], you and your health needs are our priority.  As part of our continuing mission to provide you with exceptional heart care, we have created designated Provider Care Teams.  These Care Teams include your primary Cardiologist (physician) and Advanced Practice Providers (APPs -  Physician Assistants and Nurse Practitioners) who all work together to provide you with the care you need, when you need it.  We recommend signing up for the patient portal called "MyChart".  Sign up information is provided on this After Visit Summary.  MyChart is used to connect with patients for Virtual Visits (Telemedicine).  Patients are able to view lab/test results, encounter notes, upcoming appointments, etc.  Non-urgent messages can be sent to your provider as well.   To learn more about what you can do with MyChart, go to [email or web address].    Your next  appointment:   1 month(s)  The format for your next appointment:   In Person  Provider:   Jenne Campus, MD   Other Instructions  Ranolazine tablets, extended release What is this medicine? RANOLAZINE (ra NOE la zeen) is a heart medicine. It is used to treat chronic chest pain (angina). This medicine must be taken regularly. It will not relieve an acute episode of chest pain. This medicine may be used for other purposes; ask your health care provider or pharmacist if you have questions. COMMON BRAND NAME(S): Ranexa What should I tell my health care provider before I take this medicine? They need to know if you have any of these conditions:  heart disease  irregular heartbeat  kidney disease  liver disease  low levels of potassium or magnesium in the blood  an unusual or allergic reaction to ranolazine, other medicines, foods, dyes, or preservatives  pregnant or trying to get pregnant  breast-feeding How should I use this medicine? Take this medicine by mouth with a glass of water. Follow the directions on the prescription label. Do not cut, crush, or chew this medicine. Take with or without food. Do not take this medication with grapefruit juice. Take your doses at regular intervals. Do not take your medicine more often then directed. Talk to your pediatrician regarding the use of this medicine in children. Special care may be needed. Overdosage: If you think you have taken too much of this medicine contact a poison control center or emergency room at once.  NOTE: This medicine is only for you. Do not share this medicine with others. What if I miss a dose? If you miss a dose, take it as soon as you can. If it is almost time for your next dose, take only that dose. Do not take double or extra doses. What may interact with this medicine? Do not take this medicine with any of the following medications:  antivirals for HIV or AIDS  cerivastatin  certain antibiotics like  chloramphenicol, clarithromycin, dalfopristin; quinupristin, isoniazid, rifabutin, rifampin, rifapentine  certain medicines used for cancer like imatinib, nilotinib  certain medicines for fungal infections like fluconazole, itraconazole, ketoconazole, posaconazole, voriconazole  certain medicines for irregular heart beat like dronedarone  certain medicines for seizures like carbamazepine, fosphenytoin, oxcarbazepine, phenobarbital, phenytoin  cisapride  conivaptan  cyclosporine  grapefruit or grapefruit juice  lumacaftor; ivacaftor  nefazodone  pimozide  quinacrine  St John's wort  thioridazine This medicine may also interact with the following medications:  alfuzosin  certain medicines for depression, anxiety, or psychotic disturbances like bupropion, citalopram, fluoxetine, fluphenazine, paroxetine, perphenazine, risperidone, sertraline, trifluoperazine  certain medicines for cholesterol like atorvastatin, lovastatin, simvastatin  certain medicines for stomach problems like octreotide, palonosetron, prochlorperazine  eplerenone  ergot alkaloids like dihydroergotamine, ergonovine, ergotamine, methylergonovine  metformin  nicardipine  other medicines that prolong the QT interval (cause an abnormal heart rhythm) like dofetilide, ziprasidone  sirolimus  tacrolimus This list may not describe all possible interactions. Give your health care provider a list of all the medicines, herbs, non-prescription drugs, or dietary supplements you use. Also tell them if you smoke, drink alcohol, or use illegal drugs. Some items may interact with your medicine. What should I watch for while using this medicine? Visit your doctor for regular check ups. Tell your doctor or healthcare professional if your symptoms do not start to get better or if they get worse. This medicine will not relieve an acute attack of angina or chest pain. This medicine can change your heart rhythm. Your  health care provider may check your heart rhythm by ordering an electrocardiogram (ECG) while you are taking this medicine. You may get drowsy or dizzy. Do not drive, use machinery, or do anything that needs mental alertness until you know how this medicine affects you. Do not stand or sit up quickly, especially if you are an older patient. This reduces the risk of dizzy or fainting spells. Alcohol may interfere with the effect of this medicine. Avoid alcoholic drinks. If you are scheduled for any medical or dental procedure, tell your healthcare provider that you are taking this medicine. This medicine can interact with other medicines used during surgery. What side effects may I notice from receiving this medicine? Side effects that you should report to your doctor or health care professional as soon as possible:  allergic reactions like skin rash, itching or hives, swelling of the face, lips, or tongue  breathing problems  changes in vision  fast, irregular or pounding heartbeat  feeling faint or lightheaded, falls  low or high blood pressure  numbness or tingling feelings  ringing in the ears  tremor or shakiness  slow heartbeat (fewer than 50 beats per minute)  swelling of the legs or feet Side effects that usually do not require medical attention (report to your doctor or health care professional if they continue or are bothersome):  constipation  drowsy  dry mouth  headache  nausea or vomiting  stomach upset This list may not describe all possible side effects.  Call your doctor for medical advice about side effects. You may report side effects to FDA at 1-800-FDA-1088. Where should I keep my medicine? Keep out of the reach of children. Store at room temperature between 15 and 30 degrees C (59 and 86 degrees F). Throw away any unused medicine after the expiration date. NOTE: This sheet is a summary. It may not cover all possible information. If you have questions  about this medicine, talk to your doctor, pharmacist, or health care provider.  2020 Elsevier/Gold Standard (2018-03-11 09:18:49)  Nitroglycerin sublingual tablets What is this medicine? NITROGLYCERIN (nye troe GLI ser in) is a type of vasodilator. It relaxes blood vessels, increasing the blood and oxygen supply to your heart. This medicine is used to relieve chest pain caused by angina. It is also used to prevent chest pain before activities like climbing stairs, going outdoors in cold weather, or sexual activity. This medicine may be used for other purposes; ask your health care provider or pharmacist if you have questions. COMMON BRAND NAME(S): Nitroquick, Nitrostat, Nitrotab What should I tell my health care provider before I take this medicine? They need to know if you have any of these conditions:  anemia  head injury, recent stroke, or bleeding in the brain  liver disease  previous heart attack  an unusual or allergic reaction to nitroglycerin, other medicines, foods, dyes, or preservatives  pregnant or trying to get pregnant  breast-feeding How should I use this medicine? Take this medicine by mouth as needed. At the first sign of an angina attack (chest pain or tightness) place one tablet under your tongue. You can also take this medicine 5 to 10 minutes before an event likely to produce chest pain. Follow the directions on the prescription label. Let the tablet dissolve under the tongue. Do not swallow whole. Replace the dose if you accidentally swallow it. It will help if your mouth is not dry. Saliva around the tablet will help it to dissolve more quickly. Do not eat or drink, smoke or chew tobacco while a tablet is dissolving. If you are not better within 5 minutes after taking ONE dose of nitroglycerin, call 9-1-1 immediately to seek emergency medical care. Do not take more than 3 nitroglycerin tablets over 15 minutes. If you take this medicine often to relieve symptoms of  angina, your doctor or health care professional may provide you with different instructions to manage your symptoms. If symptoms do not go away after following these instructions, it is important to call 9-1-1 immediately. Do not take more than 3 nitroglycerin tablets over 15 minutes. Talk to your pediatrician regarding the use of this medicine in children. Special care may be needed. Overdosage: If you think you have taken too much of this medicine contact a poison control center or emergency room at once. NOTE: This medicine is only for you. Do not share this medicine with others. What if I miss a dose? This does not apply. This medicine is only used as needed. What may interact with this medicine? Do not take this medicine with any of the following medications:  certain migraine medicines like ergotamine and dihydroergotamine (DHE)  medicines used to treat erectile dysfunction like sildenafil, tadalafil, and vardenafil  riociguat This medicine may also interact with the following medications:  alteplase  aspirin  heparin  medicines for high blood pressure  medicines for mental depression  other medicines used to treat angina  phenothiazines like chlorpromazine, mesoridazine, prochlorperazine, thioridazine This list may not describe all  possible interactions. Give your health care provider a list of all the medicines, herbs, non-prescription drugs, or dietary supplements you use. Also tell them if you smoke, drink alcohol, or use illegal drugs. Some items may interact with your medicine. What should I watch for while using this medicine? Tell your doctor or health care professional if you feel your medicine is no longer working. Keep this medicine with you at all times. Sit or lie down when you take your medicine to prevent falling if you feel dizzy or faint after using it. Try to remain calm. This will help you to feel better faster. If you feel dizzy, take several deep breaths and  lie down with your feet propped up, or bend forward with your head resting between your knees. You may get drowsy or dizzy. Do not drive, use machinery, or do anything that needs mental alertness until you know how this drug affects you. Do not stand or sit up quickly, especially if you are an older patient. This reduces the risk of dizzy or fainting spells. Alcohol can make you more drowsy and dizzy. Avoid alcoholic drinks. Do not treat yourself for coughs, colds, or pain while you are taking this medicine without asking your doctor or health care professional for advice. Some ingredients may increase your blood pressure. What side effects may I notice from receiving this medicine? Side effects that you should report to your doctor or health care professional as soon as possible:  blurred vision  dry mouth  skin rash  sweating  the feeling of extreme pressure in the head  unusually weak or tired Side effects that usually do not require medical attention (report to your doctor or health care professional if they continue or are bothersome):  flushing of the face or neck  headache  irregular heartbeat, palpitations  nausea, vomiting This list may not describe all possible side effects. Call your doctor for medical advice about side effects. You may report side effects to FDA at 1-800-FDA-1088. Where should I keep my medicine? Keep out of the reach of children. Store at room temperature between 20 and 25 degrees C (68 and 77 degrees F). Store in Retail buyer. Protect from light and moisture. Keep tightly closed. Throw away any unused medicine after the expiration date. NOTE: This sheet is a summary. It may not cover all possible information. If you have questions about this medicine, talk to your doctor, pharmacist, or health care provider.  2020 Elsevier/Gold Standard (2013-01-15 17:57:36)

## 2019-09-15 ENCOUNTER — Encounter: Payer: Self-pay | Admitting: Cardiology

## 2019-09-15 ENCOUNTER — Other Ambulatory Visit: Payer: Self-pay

## 2019-09-15 ENCOUNTER — Ambulatory Visit: Payer: PRIVATE HEALTH INSURANCE | Admitting: Cardiology

## 2019-09-15 VITALS — BP 120/70 | HR 83 | Ht 73.0 in | Wt 220.0 lb

## 2019-09-15 DIAGNOSIS — E785 Hyperlipidemia, unspecified: Secondary | ICD-10-CM

## 2019-09-15 DIAGNOSIS — R0789 Other chest pain: Secondary | ICD-10-CM

## 2019-09-15 DIAGNOSIS — K754 Autoimmune hepatitis: Secondary | ICD-10-CM

## 2019-09-15 DIAGNOSIS — D580 Hereditary spherocytosis: Secondary | ICD-10-CM

## 2019-09-15 DIAGNOSIS — R9439 Abnormal result of other cardiovascular function study: Secondary | ICD-10-CM

## 2019-09-15 NOTE — Patient Instructions (Signed)
Medication Instructions:  Your physician recommends that you continue on your current medications as directed. Please refer to the Current Medication list given to you today.  *If you need a refill on your cardiac medications before your next appointment, please call your pharmacy*   Lab Work:  None.  If you have labs (blood work) drawn today and your tests are completely normal, you will receive your results only by: Marland Kitchen MyChart Message (if you have MyChart) OR . A paper copy in the mail If you have any lab test that is abnormal or we need to change your treatment, we will call you to review the results.   Testing/Procedures: None.    Follow-Up: At [facility], you and your health needs are our priority.  As part of our continuing mission to provide you with exceptional heart care, we have created designated Provider Care Teams.  These Care Teams include your primary Cardiologist (physician) and Advanced Practice Providers (APPs -  Physician Assistants and Nurse Practitioners) who all work together to provide you with the care you need, when you need it.  We recommend signing up for the patient portal called "MyChart".  Sign up information is provided on this After Visit Summary.  MyChart is used to connect with patients for Virtual Visits (Telemedicine).  Patients are able to view lab/test results, encounter notes, upcoming appointments, etc.  Non-urgent messages can be sent to your provider as well.   To learn more about what you can do with MyChart, go to [email or web address].    Your next appointment:   6 week(s)  The format for your next appointment:   In Person  Provider:   Gypsy Balsam, MD   Other Instructions  Dr. Bing Matter has referred you to hematology. They should call you within 1 week. If not please call out office.

## 2019-09-15 NOTE — Progress Notes (Signed)
Cardiology Office Note:    Date:  09/15/2019   ID:  Samuel Dunn, DOB Jul 10, 1960, MRN [id number]  PCP:  Marco Collie, MD  Cardiologist:  Jenne Campus, MD    Referring MD: Marco Collie, MD   Chief Complaint  Patient presents with  . Follow-up    6 WK FU   Still not feeling well  History of Present Illness:    Samuel Dunn is a 59 y.o. male with past medical history significant for diabetes mellitus, abnormal stress test showing apical defect, dyslipidemia, spherocytosis, status post splenectomy.  Comes today to my office for follow-up last time we discussed results of his test and will talk about therapy for it.  We were discussing 2 options cardiac catheterization versus medical therapy, he make a choice of medical therapy I put him on ranolazine he comes today to my for follow-up he is not feeling any better.  Described to have some atypical chest pain that usually happen at rest.  He is able to walk in the garden he does have a lot of tomato plants and have no difficulty doing it.  However today he said he is feeling short of breath he does have some dizziness sometimes in which he wants to have a cardiac catheterization.  We discussed procedure including all risk benefits as well as alternatives.  He does have history of spherocytosis as well as some bleeding disorder.  He will be referred back to hematology for assessment before cardiac catheterization.  He used to see hematologist years ago in [place] that was 7 to 8 years ago.  I have no access to this note.  Again he will be referred to hematology.  Past Medical History:  Diagnosis Date  . Allergic rhinitis   . Autoimmune hepatitis ([place]) 04/09/2011  . Benign prostatic hyperplasia without lower urinary tract symptoms   . Chronic hepatitis C ([place])   . Diabetes mellitus ([place]) 08/03/2019  . ED (erectile dysfunction)   . Erythrocytosis 08/03/2019  . Hematoma 10/08/2012  . 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 Hyperlipidemia 08/03/2019  . Hypertension 08/03/2019  . Hypogonadism male   . Insomnia   . Paroxysmal tachycardia, unspecified ([place])   . 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 Ventricular premature depolarization   . Vitamin D deficiency       Current Medications: Current Meds  Medication Sig  . albuterol (VENTOLIN HFA) 108 (90 Base) MCG/ACT inhaler Inhale 2 puffs into the lungs every 4 (four) hours as needed for wheezing or shortness of breath.  . azaTHIOprine (IMURAN) 50 MG tablet Take 50 mg by mouth daily.  Marland Kitchen azaTHIOprine (IMURAN) 50 MG tablet Take 50 mg by mouth daily.  Marland Kitchen azelastine (ASTELIN) 0.1 % nasal spray Place 2 sprays into both nostrils 2 (two) times daily.  . citalopram (CELEXA) 10 MG tablet Take 10 mg by mouth daily.  . citalopram (CELEXA) 10 MG tablet Take 10 mg by mouth daily.  . Continuous Blood Gluc Sensor (FREESTYLE LIBRE 14 DAY SENSOR) MISC change every 14 days  . dapagliflozin propanediol (FARXIGA) 10 MG TABS tablet Take 10 mg by mouth daily.  Marland Kitchen FARXIGA 10 MG TABS tablet Take 10 mg by mouth daily.  . fenofibrate 160 MG tablet Take 160 mg by mouth daily.  . fenofibrate 160 MG tablet Take 160 mg by mouth daily.  . fluticasone (FLONASE) 50 MCG/ACT nasal spray Place 1 spray into both nostrils daily.  . fluticasone (FLONASE) 50 MCG/ACT nasal spray Place  into both nostrils 2 (two) times daily.  Marland Kitchen guaifenesin (ROBITUSSIN) 100 MG/5ML syrup Take 200 mg by mouth as needed for cough.  . hydrochlorothiazide (HYDRODIURIL) 12.5 MG tablet Take 12.5 mg by mouth daily.  Marland Kitchen icosapent Ethyl (VASCEPA) 1 g capsule Take 2 g by mouth 2 (two) times daily.  Marland Kitchen levocetirizine (XYZAL) 5 MG tablet 5 mg every evening. As Needed  . levocetirizine (XYZAL) 5 MG tablet Take 5 mg by mouth every evening.  . metoprolol succinate (TOPROL-XL) 50 MG 24 hr tablet Take 1 tablet (50 mg total) by mouth daily. Take with or immediately following a meal.  . nitroGLYCERIN (NITROSTAT) 0.4 MG SL tablet Place 1 tablet (0.4  mg total) under the tongue every 5 (five) minutes as needed for chest pain.  . Omega-3 Fatty Acids (FISH OIL) 1000 MG CAPS Take by mouth 2 (two) times daily.  . pioglitazone (ACTOS) 15 MG tablet Take 15 mg by mouth daily.  . pioglitazone (ACTOS) 15 MG tablet Take 15 mg by mouth daily.  . ranolazine (RANEXA) 500 MG 12 hr tablet Take 1 tablet (500 mg total) by mouth 2 (two) times daily.  . rosuvastatin (CRESTOR) 20 MG tablet Take 20 mg by mouth daily.  . rosuvastatin (CRESTOR) 20 MG tablet Take 20 mg by mouth daily.  . sildenafil (REVATIO) 20 MG tablet Take 20 mg by mouth as needed (Take 1-4 tablets 1 hour before activity as needed).  . sitaGLIPtin-metformin (JANUMET) 50-1000 MG tablet Take 1 tablet by mouth 2 (two) times daily with a meal.  . SitaGLIPtin-MetFORMIN HCl 50-1000 MG TB24 Take by mouth in the morning and at bedtime.  . tamsulosin (FLOMAX) 0.4 MG CAPS capsule Take 0.4 mg by mouth daily.  Marland Kitchen telmisartan (MICARDIS) 80 MG tablet Take 80 mg by mouth daily.  Marland Kitchen telmisartan-hydrochlorothiazide (MICARDIS HCT) 80-12.5 MG tablet Take 1 tablet by mouth daily.  Marland Kitchen telmisartan-hydrochlorothiazide (MICARDIS HCT) 80-12.5 MG tablet Take 1 tablet by mouth daily.  Marland Kitchen VASCEPA 1 g capsule Take 2 capsules (2,000 mg) by mouth 2 times per day with food  . Vitamin D, Ergocalciferol, (DRISDOL) 1.25 MG (50000 UNIT) CAPS capsule Take 50,000 Units by mouth once a week.     Allergies:   Patient has no known allergies.   Social History   Socioeconomic History  . Marital status: Married    Spouse name: Not on file  . Number of children: Not on file  . Years of education: Not on file  . Highest education level: Not on file  Occupational History  . Not on file  Tobacco Use  . Smoking status: Never Smoker  . Smokeless tobacco: Never Used  Substance and Sexual Activity  . Alcohol use: Never  . Drug use: Not on file  . Sexual activity: Not on file  Other Topics Concern  . Not on file  Social History  Narrative  . Not on file   Social Determinants of Health   Financial Resource Strain:   . Difficulty of Paying Living Expenses:   Food Insecurity:   . Worried About Programme researcher, broadcasting/film/video in the Last Year:   . Barista in the Last Year:   Transportation Needs:   . Freight forwarder (Medical):   Marland Kitchen Lack of Transportation (Non-Medical):   Physical Activity:   . Days of Exercise per Week:   . Minutes of Exercise per Session:   Stress:   . Feeling of Stress :   Social Connections:   .  Frequency of Communication with Friends and Family:   . Frequency of Social Gatherings with Friends and Family:   . Attends Religious Services:   . Active Member of Clubs or Organizations:   . Attends Banker Meetings:   Marland Kitchen Marital Status:      Family History: The patient's family history is not on file. ROS:   Please see the history of present illness.    All 14 point review of systems negative except as described per history of present illness  EKGs/Labs/Other Studies Reviewed:      Recent Labs: No results found for requested labs within last 8760 hours.  Recent Lipid Panel No results found for: CHOL, TRIG, HDL, CHOLHDL, VLDL, LDLCALC, LDLDIRECT  Physical Exam:    VS:  BP 120/70 (BP Location: Right Arm, Patient Position: Sitting, Cuff Size: Normal)   Pulse 83   Ht 6\' 1"  (1.854 m)   Wt 220 lb (99.8 kg)   SpO2 93%   BMI 29.03 kg/m     Wt Readings from Last 3 Encounters:  09/15/19 220 lb (99.8 kg)  08/18/19 215 lb (97.5 kg)  08/06/19 215 lb (97.5 kg)     GEN:  Well nourished, well developed in no acute distress HEENT: Normal NECK: No JVD; No carotid bruits LYMPHATICS: No lymphadenopathy CARDIAC: RRR, no murmurs, no rubs, no gallops RESPIRATORY:  Clear to auscultation without rales, wheezing or rhonchi  ABDOMEN: Soft, non-tender, non-distended MUSCULOSKELETAL:  No edema; No deformity  SKIN: Warm and dry LOWER EXTREMITIES: no swelling NEUROLOGIC:  Alert  and oriented x 3 PSYCHIATRIC:  Normal affect   ASSESSMENT:    1. Abnormal stress test   2. Atypical chest pain   3. Dyslipidemia   4. Hereditary spherocytosis (HCC)   5. Autoimmune hepatitis (H[facility])    PLAN:    In order of problems listed above:  1. Abnormal stress test.  He wants to have a cardiac catheterization still described to have some chest pain but but those pains not related to exercise.  I put him the ranolazine with not much response.  We will continue rest of his medications for now.  He wants to have a cardiac catheterization however because of his bleeding disorder he will need to see hematology before it. 2. Dyslipidemia Vascepa Crestor which I will continue. 3. Spherocytosis again he will be referred to hematology 4. Autoimmune hepatitis apparently stable. 5. He is reeval.  And he was told again that he should not use it when he use nitroglycerin.   Medication Adjustments/Labs and Tests Ordered: Current medicines are reviewed at length with the patient today.  Concerns regarding medicines are outlined above.  No orders of the defined types were placed in this encounter.  Medication changes: No orders of the defined types were placed in this encounter.   Signed, 10/06/19, MD, [facility] 09/15/2019 10:33 AM    [facility]

## 2019-10-01 DIAGNOSIS — D66 Hereditary factor VIII deficiency: Secondary | ICD-10-CM

## 2019-10-01 DIAGNOSIS — D580 Hereditary spherocytosis: Secondary | ICD-10-CM

## 2019-10-22 ENCOUNTER — Ambulatory Visit: Payer: PRIVATE HEALTH INSURANCE | Admitting: Cardiology

## 2019-11-03 ENCOUNTER — Other Ambulatory Visit: Payer: Self-pay | Admitting: Cardiology

## 2019-11-24 ENCOUNTER — Other Ambulatory Visit: Payer: Self-pay

## 2019-11-24 ENCOUNTER — Ambulatory Visit: Payer: PRIVATE HEALTH INSURANCE | Admitting: Cardiology

## 2019-11-24 ENCOUNTER — Encounter: Payer: Self-pay | Admitting: Cardiology

## 2019-11-24 DIAGNOSIS — R9439 Abnormal result of other cardiovascular function study: Secondary | ICD-10-CM

## 2019-11-24 DIAGNOSIS — R072 Precordial pain: Secondary | ICD-10-CM | POA: Diagnosis not present

## 2019-11-24 DIAGNOSIS — I10 Essential (primary) hypertension: Secondary | ICD-10-CM

## 2019-11-24 DIAGNOSIS — Z9081 Acquired absence of spleen: Secondary | ICD-10-CM

## 2019-11-24 DIAGNOSIS — R0789 Other chest pain: Secondary | ICD-10-CM | POA: Diagnosis not present

## 2019-11-24 DIAGNOSIS — D66 Hereditary factor VIII deficiency: Secondary | ICD-10-CM | POA: Diagnosis not present

## 2019-11-24 DIAGNOSIS — E785 Hyperlipidemia, unspecified: Secondary | ICD-10-CM

## 2019-11-24 DIAGNOSIS — I471 Supraventricular tachycardia: Secondary | ICD-10-CM

## 2019-11-24 NOTE — Patient Instructions (Addendum)
Medication Instructions:  Your physician recommends that you continue on your current medications as directed. Please refer to the Current Medication list given to you today.  *If you need a refill on your cardiac medications before your next appointment, please call your pharmacy*   Lab Work: Your physician recommends that you return for lab work 1 week before CT  If you have labs (blood work) drawn today and your tests are completely normal, you will receive your results only by: Marland Kitchen MyChart Message (if you have MyChart) OR . A paper copy in the mail If you have any lab test that is abnormal or we need to change your treatment, we will call you to review the results.   Testing/Procedures: Non-Cardiac CT Angiography (CTA), is a special type of CT scan that uses a computer to produce multi-dimensional views of major blood vessels throughout the body. In CT angiography, a contrast material is injected through an IV to help visualize the blood vessels\   Follow-Up: At [facility], you and your health needs are our priority.  As part of our continuing mission to provide you with exceptional heart care, we have created designated Provider Care Teams.  These Care Teams include your primary Cardiologist (physician) and Advanced Practice Providers (APPs -  Physician Assistants and Nurse Practitioners) who all work together to provide you with the care you need, when you need it.  We recommend signing up for the patient portal called "MyChart".  Sign up information is provided on this After Visit Summary.  MyChart is used to connect with patients for Virtual Visits (Telemedicine).  Patients are able to view lab/test results, encounter notes, upcoming appointments, etc.  Non-urgent messages can be sent to your provider as well.   To learn more about what you can do with MyChart, go to [email or web address].    Your next appointment:   3 month(s)  The format for your next appointment:   In  Person  Provider:   Jenne Campus, MD   Other Instructions Your cardiac CT will be scheduled at one of the below locations:   [facility] [address] [place], [place] [address] [phone]   If scheduled at [facility], please arrive at the [facility] main entrance of [facility] 30 minutes prior to test start time. Proceed to the [facility] Radiology Department (first floor) to check-in and test prep.  Please follow these instructions carefully (unless otherwise directed):  Hold all erectile dysfunction medications at least 3 days (72 hrs) prior to test.  On the Night Before the Test: . Be sure to Drink plenty of water. . Do not consume any caffeinated/decaffeinated beverages or chocolate 12 hours prior to your test. . Do not take any antihistamines 12 hours prior to your test.  On the Day of the Test: . Drink plenty of water. Do not drink any water within one hour of the test. . Do not eat any food 4 hours prior to the test. . You may take your regular medications prior to the test.  . Take metoprolol (Lopressor) two hours prior to test. . HOLD Hydrochlorothiazide morning of the test.e      After the Test: . Drink plenty of water. . After receiving IV contrast, you may experience a mild flushed feeling. This is normal. . On occasion, you may experience a mild rash up to 24 hours after the test. This is not dangerous. If this occurs, you can take Benadryl 25 mg and  increase your fluid intake. . If you experience trouble breathing, this can be serious. If it is severe call 911 IMMEDIATELY. If it is mild, please call our office. . If you take any of these medications: Glipizide/Metformin, Avandament, Glucavance, please do not take 48 hours after completing test unless otherwise instructed.   Once we have confirmed authorization from your insurance company, we will call you to set up a date and time for your test. Based on how quickly  your insurance processes prior authorizations requests, please allow up to 4 weeks to be contacted for scheduling your Cardiac CT appointment. Be advised that routine Cardiac CT appointments could be scheduled as many as 8 weeks after your provider has ordered it.  For non-scheduling related questions, please contact the cardiac imaging nurse navigator should you have any questions/concerns: Marchia Bond, Cardiac Imaging Nurse Navigator Burley Saver, Interim Cardiac Imaging Nurse [place] [facility] Direct Office Dial: [phone]   For scheduling needs, including cancellations and rescheduling, please call Vivien Rota at [phone], option 3.

## 2019-11-24 NOTE — Progress Notes (Signed)
Cardiology Office Note:    Date:  11/24/2019   ID:  Samuel Dunn, DOB 12-27-1960, MRN [id number]  PCP:  Abner Greenspan, MD  Cardiologist:  Gypsy Balsam, MD    Referring MD: Abner Greenspan, MD   Chief Complaint  Patient presents with  . Follow-up  I am doing fine  History of Present Illness:    Samuel Dunn is a 59 y.o. male with past medical history significant for diabetes, abnormal stress test showing apical defect, hemophilia A, status post splenectomy.  Come today to my office discuss plans.  Overall he said he is doing fine he is able to walk in the garden have no difficulty he gets some shortness of breath but recently at least denies having any chest pain tightness squeezing pressure burning chest.  I put him previously on her lungs and with not much response.  We were getting ready to proceed with cardiac catheterization back because of his hemophilia a he was sent to see hematologist in preparation for his cardiac catheterization.  He did see Dr. Jola Babinski and appreciate his expertise.  He is thinking was that he is relatively low risk for potential bleeding and is very specific instruction how to manage his problems with factor VIII transfusion before and after cardiac catheterization.  I discussed this issue in length length with the patient.  Past Medical History:  Diagnosis Date  . Allergic rhinitis   . 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 Benign prostatic hyperplasia without lower urinary tract symptoms   . Chronic hepatitis C ([facility])   . Diabetes mellitus ([facility]) 08/03/2019  . ED (erectile dysfunction)   . Erythrocytosis 08/03/2019  . Hematoma 10/08/2012  . 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 Hereditary spherocytosis ([facility]) 08/03/2019  . Hyperlipidemia 08/03/2019  . Hypertension 08/03/2019  . Hypogonadism male   . Insomnia   . Paroxysmal tachycardia, unspecified ([facility])   . 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 Ventricular premature depolarization   . Vitamin D deficiency        Current Medications: Current Meds  Medication Sig  . albuterol (VENTOLIN HFA) 108 (90 Base) MCG/ACT inhaler Inhale 2 puffs into the lungs every 4 (four) hours as needed for wheezing or shortness of breath.  . azaTHIOprine (IMURAN) 50 MG tablet Take 50 mg by mouth daily.  Marland Kitchen azaTHIOprine (IMURAN) 50 MG tablet Take 50 mg by mouth daily.  Marland Kitchen azelastine (ASTELIN) 0.1 % nasal spray Place 2 sprays into both nostrils 2 (two) times daily.  . citalopram (CELEXA) 10 MG tablet Take 10 mg by mouth daily.  . Continuous Blood Gluc Sensor (FREESTYLE LIBRE 14 DAY SENSOR) MISC change every 14 days  . dapagliflozin propanediol (FARXIGA) 10 MG TABS tablet Take 10 mg by mouth daily.  Marland Kitchen FARXIGA 10 MG TABS tablet Take 10 mg by mouth daily.  . fenofibrate 160 MG tablet Take 160 mg by mouth daily.  . fluticasone (FLONASE) 50 MCG/ACT nasal spray Place 1 spray into both nostrils daily.  Marland Kitchen guaifenesin (ROBITUSSIN) 100 MG/5ML syrup Take 200 mg by mouth as needed for cough.  . hydrochlorothiazide (HYDRODIURIL) 12.5 MG tablet Take 12.5 mg by mouth daily.  Marland Kitchen icosapent Ethyl (VASCEPA) 1 g capsule Take 2 g by mouth 2 (two) times daily.  Marland Kitchen levocetirizine (XYZAL) 5 MG tablet 5 mg every evening. As Needed  . metoprolol succinate (TOPROL-XL) 50 MG 24 hr tablet Take 1 tablet (50 mg total) by mouth daily. Take with or immediately following a meal.  . nitroGLYCERIN (NITROSTAT) 0.4  MG SL tablet Place 1 tablet (0.4 mg total) under the tongue every 5 (five) minutes as needed for chest pain.  . Omega-3 Fatty Acids (FISH OIL) 1000 MG CAPS Take by mouth 2 (two) times daily.  . pioglitazone (ACTOS) 15 MG tablet Take 15 mg by mouth daily.  . ranolazine (RANEXA) 500 MG 12 hr tablet Take 1 tablet (500 mg total) by mouth 2 (two) times daily.  . rosuvastatin (CRESTOR) 20 MG tablet Take 20 mg by mouth daily.  . sildenafil (REVATIO) 20 MG tablet Take 20 mg by mouth as needed (Take 1-4 tablets 1 hour before activity as needed).  .  sitaGLIPtin-metformin (JANUMET) 50-1000 MG tablet Take 1 tablet by mouth 2 (two) times daily with a meal.  . tamsulosin (FLOMAX) 0.4 MG CAPS capsule Take 0.4 mg by mouth daily.  Marland Kitchen telmisartan (MICARDIS) 80 MG tablet Take 80 mg by mouth daily.  Marland Kitchen telmisartan-hydrochlorothiazide (MICARDIS HCT) 80-12.5 MG tablet Take 1 tablet by mouth daily.  Marland Kitchen VASCEPA 1 g capsule Take 2 capsules (2,000 mg) by mouth 2 times per day with food  . Vitamin D, Ergocalciferol, (DRISDOL) 1.25 MG (50000 UNIT) CAPS capsule Take 50,000 Units by mouth once a week.  . [DISCONTINUED] citalopram (CELEXA) 10 MG tablet Take 10 mg by mouth daily.  . [DISCONTINUED] fenofibrate 160 MG tablet Take 160 mg by mouth daily.  . [DISCONTINUED] fluticasone (FLONASE) 50 MCG/ACT nasal spray Place into both nostrils 2 (two) times daily.  . [DISCONTINUED] levocetirizine (XYZAL) 5 MG tablet Take 5 mg by mouth every evening.  . [DISCONTINUED] pioglitazone (ACTOS) 15 MG tablet Take 15 mg by mouth daily.  . [DISCONTINUED] rosuvastatin (CRESTOR) 20 MG tablet Take 20 mg by mouth daily.  . [DISCONTINUED] SitaGLIPtin-MetFORMIN HCl 50-1000 MG TB24 Take by mouth in the morning and at bedtime.  . [DISCONTINUED] telmisartan-hydrochlorothiazide (MICARDIS HCT) 80-12.5 MG tablet Take 1 tablet by mouth daily.     Allergies:   Patient has no known allergies.   Social History   Socioeconomic History  . Marital status: Married    Spouse name: Not on file  . Number of children: Not on file  . Years of education: Not on file  . Highest education level: Not on file  Occupational History  . Not on file  Tobacco Use  . Smoking status: Never Smoker  . Smokeless tobacco: Never Used  Substance and Sexual Activity  . Alcohol use: Never  . Drug use: Not on file  . Sexual activity: Not on file  Other Topics Concern  . Not on file  Social History Narrative  . Not on file   Social Determinants of Health   Financial Resource Strain:   . Difficulty of  Paying Living Expenses: Not on file  Food Insecurity:   . Worried About Programme researcher, broadcasting/film/video in the Last Year: Not on file  . Ran Out of Food in the Last Year: Not on file  Transportation Needs:   . Lack of Transportation (Medical): Not on file  . Lack of Transportation (Non-Medical): Not on file  Physical Activity:   . Days of Exercise per Week: Not on file  . Minutes of Exercise per Session: Not on file  Stress:   . Feeling of Stress : Not on file  Social Connections:   . Frequency of Communication with Friends and Family: Not on file  . Frequency of Social Gatherings with Friends and Family: Not on file  . Attends Religious Services: Not on file  .  Active Member of Clubs or Organizations: Not on file  . Attends Banker Meetings: Not on file  . Marital Status: Not on file     Family History: The patient's family history is not on file. ROS:   Please see the history of present illness.    All 14 point review of systems negative except as described per history of present illness  EKGs/Labs/Other Studies Reviewed:      Recent Labs: No results found for requested labs within last 8760 hours.  Recent Lipid Panel No results found for: CHOL, TRIG, HDL, CHOLHDL, VLDL, LDLCALC, LDLDIRECT  Physical Exam:    VS:  BP 130/82 (BP Location: Left Arm, Patient Position: Sitting, Cuff Size: Normal)   Pulse 64   Ht 6\' 1"  (1.854 m)   Wt 224 lb (101.6 kg)   SpO2 97%   BMI 29.55 kg/m     Wt Readings from Last 3 Encounters:  11/24/19 224 lb (101.6 kg)  09/15/19 220 lb (99.8 kg)  08/18/19 215 lb (97.5 kg)     GEN:  Well nourished, well developed in no acute distress HEENT: Normal NECK: No JVD; No carotid bruits LYMPHATICS: No lymphadenopathy CARDIAC: RRR, no murmurs, no rubs, no gallops RESPIRATORY:  Clear to auscultation without rales, wheezing or rhonchi  ABDOMEN: Soft, non-tender, non-distended MUSCULOSKELETAL:  No edema; No deformity  SKIN: Warm and dry LOWER  EXTREMITIES: no swelling NEUROLOGIC:  Alert and oriented x 3 PSYCHIATRIC:  Normal affect   ASSESSMENT:    1. Hemophilia A (HCC)   2. Precordial pain   3. Abnormal stress test   4. Atypical chest pain   5. Dyslipidemia   6. S/P splenectomy   7. SVT (supraventricular tachycardia) (HCC)   8. Essential hypertension    PLAN:    In order of problems listed above:  1. Hemophilia A.  He did see hematology Dr. 08/20/19 and I appreciate his expertise.  There is a very specific instruction how to manage patient around cardiac catheterization time.  However, he does carry some increased risk of bleeding we talked in length about this with the patient and we elected to not pursue cardiac catheterization right away, we will get some axillary testing namely we talked about doing coronary CT angiogram trying to determine how extensive problem he-based on that decide if we want to do cardiac catheterization. 2. Precordial chest pain very atypical denies having any lately. 3. Abnormal stress test with apical ischemia plan is to pursue coronary CT angio in view of the fact that he does have hemophilia A this includes risk of bleeding for potential cardiac catheterization.  Obviously if we will have confirmation of coronary artery disease by coronary CT angio then cardiac catheterization will be warranted. 4. Dyslipidemia: He is on Crestor 20 mg daily which I will continue. 5. History of SVT: Denies having any issues. 6. Essential hypertension: Blood pressure well controlled continue present management.  This is a fairly complicated case.  I did review note by Dr. Jola Babinski.   Medication Adjustments/Labs and Tests Ordered: Current medicines are reviewed at length with the patient today.  Concerns regarding medicines are outlined above.  No orders of the defined types were placed in this encounter.  Medication changes: No orders of the defined types were placed in this  encounter.   Signed, Jola Babinski, MD, [facility] 11/24/2019 8:53 AM    [facility]

## 2020-01-27 ENCOUNTER — Other Ambulatory Visit: Payer: Self-pay | Admitting: Cardiology

## 2020-02-03 ENCOUNTER — Other Ambulatory Visit: Payer: Self-pay | Admitting: Cardiology

## 2020-02-29 DIAGNOSIS — I479 Paroxysmal tachycardia, unspecified: Secondary | ICD-10-CM | POA: Insufficient documentation

## 2020-02-29 DIAGNOSIS — J309 Allergic rhinitis, unspecified: Secondary | ICD-10-CM | POA: Insufficient documentation

## 2020-02-29 DIAGNOSIS — G4700 Insomnia, unspecified: Secondary | ICD-10-CM | POA: Insufficient documentation

## 2020-02-29 DIAGNOSIS — I493 Ventricular premature depolarization: Secondary | ICD-10-CM | POA: Insufficient documentation

## 2020-02-29 DIAGNOSIS — B182 Chronic viral hepatitis C: Secondary | ICD-10-CM | POA: Insufficient documentation

## 2020-02-29 DIAGNOSIS — E291 Testicular hypofunction: Secondary | ICD-10-CM | POA: Insufficient documentation

## 2020-02-29 DIAGNOSIS — E559 Vitamin D deficiency, unspecified: Secondary | ICD-10-CM | POA: Insufficient documentation

## 2020-02-29 DIAGNOSIS — N400 Enlarged prostate without lower urinary tract symptoms: Secondary | ICD-10-CM | POA: Insufficient documentation

## 2020-02-29 DIAGNOSIS — N529 Male erectile dysfunction, unspecified: Secondary | ICD-10-CM | POA: Insufficient documentation

## 2020-03-02 ENCOUNTER — Ambulatory Visit: Payer: PRIVATE HEALTH INSURANCE | Admitting: Cardiology

## 2020-03-02 ENCOUNTER — Other Ambulatory Visit: Payer: Self-pay

## 2020-03-02 ENCOUNTER — Encounter: Payer: Self-pay | Admitting: Cardiology

## 2020-03-02 VITALS — BP 146/80 | HR 80 | Ht 73.0 in | Wt 228.0 lb

## 2020-03-02 DIAGNOSIS — R0789 Other chest pain: Secondary | ICD-10-CM | POA: Diagnosis not present

## 2020-03-02 DIAGNOSIS — R9439 Abnormal result of other cardiovascular function study: Secondary | ICD-10-CM | POA: Diagnosis not present

## 2020-03-02 DIAGNOSIS — D66 Hereditary factor VIII deficiency: Secondary | ICD-10-CM

## 2020-03-02 DIAGNOSIS — E785 Hyperlipidemia, unspecified: Secondary | ICD-10-CM

## 2020-03-02 NOTE — Progress Notes (Signed)
Cardiology Office Note:    Date:  03/02/2020   ID:  Samuel Dunn, DOB [birth year], MRN [id number]  PCP:  Abner Greenspan, MD  Cardiologist:  Gypsy Balsam, MD    Referring MD: Abner Greenspan, MD   Chief Complaint  Patient presents with  . Follow-up  Still getting some episode of chest pain  History of Present Illness:    Samuel Dunn is a 59 y.o. male past medical history significant for diabetes, hemophilia A, status post splenectomy, he was referred to [place] because of atypical chest pain.  Stress test was done which showed small area of ischemia involving apex.  We had a long discussion about potentially doing cardiac catheterization but because of his hemophilia obviously that procedure is high risk.  Eventually end up planning to do coronary CT angio to have better understanding what the problem is with coronary arteries before proceeding directly to cardiac catheterization.  For some reason in spite of the fact the test was ordered in August he did not have it done.  Still complaining of any chest pain interestingly does happen typically at rest not with exercise.  He said he can split wood he can work have no difficulty doing it.  Does not get pain then.  He gets a little bit more tired and short of breath but no pain.  Clearly have multiple risk factors for coronary artery disease.  Past Medical History:  Diagnosis Date  . Abnormal stress test 08/18/2019  . Allergic rhinitis   . Atypical chest pain 08/03/2019  . 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 Benign prostatic hyperplasia without lower urinary tract symptoms   . Chronic hepatitis C (HCC)   . Diabetes mellitus (HCC) 08/03/2019  . Dyslipidemia 08/03/2019  . ED (erectile dysfunction)   . Erythrocytosis 08/03/2019  . Hematoma 10/08/2012  . 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 Hereditary spherocytosis (HCC) 08/03/2019  . Hyperlipidemia 08/03/2019  . Hypertension 08/03/2019  . Hypogonadism male   . Insomnia   .  Paroxysmal tachycardia, unspecified (HCC)   . 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 SVT (supraventricular tachycardia) (HCC) 08/03/2019  . Ventricular premature depolarization   . Vitamin D deficiency     Past Surgical History:  Procedure Laterality Date  . SPLENECTOMY, TOTAL      Current Medications: Current Meds  Medication Sig  . albuterol (VENTOLIN HFA) 108 (90 Base) MCG/ACT inhaler Inhale 2 puffs into the lungs every 4 (four) hours as needed for wheezing or shortness of breath.  . azaTHIOprine (IMURAN) 50 MG tablet Take 50 mg by mouth daily.  Marland Kitchen azaTHIOprine (IMURAN) 50 MG tablet Take 50 mg by mouth daily.  Marland Kitchen azelastine (ASTELIN) 0.1 % nasal spray Place 2 sprays into both nostrils 2 (two) times daily.  . citalopram (CELEXA) 10 MG tablet Take 10 mg by mouth daily.  . Continuous Blood Gluc Sensor (FREESTYLE LIBRE 14 DAY SENSOR) MISC change every 14 days  . dapagliflozin propanediol (FARXIGA) 10 MG TABS tablet Take 10 mg by mouth daily.  Marland Kitchen FARXIGA 10 MG TABS tablet Take 10 mg by mouth daily.  . fenofibrate 160 MG tablet Take 160 mg by mouth daily.  . fluticasone (FLONASE) 50 MCG/ACT nasal spray Place 1 spray into both nostrils daily.  Marland Kitchen guaifenesin (ROBITUSSIN) 100 MG/5ML syrup Take 200 mg by mouth as needed for cough.  . hydrochlorothiazide (HYDRODIURIL) 12.5 MG tablet Take 12.5 mg by mouth daily.  Marland Kitchen icosapent Ethyl (VASCEPA) 1 g capsule Take 2  g by mouth 2 (two) times daily.  Marland Kitchen levocetirizine (XYZAL) 5 MG tablet 5 mg every evening. As Needed  . metoprolol succinate (TOPROL-XL) 50 MG 24 hr tablet Take 1 tablet (50 mg total) by mouth daily. Take with or immediately following a meal.  . nitroGLYCERIN (NITROSTAT) 0.4 MG SL tablet Place 1 tablet (0.4 mg total) under the tongue every 5 (five) minutes as needed for chest pain.  . Omega-3 Fatty Acids (FISH OIL) 1000 MG CAPS Take by mouth 2 (two) times daily.  . pioglitazone (ACTOS) 15 MG tablet Take 15 mg by mouth daily.  . ranolazine (RANEXA) 500 MG  12 hr tablet TAKE ONE TABLET BY MOUTH TWICE DAILY  . rosuvastatin (CRESTOR) 20 MG tablet Take 20 mg by mouth daily.  . sildenafil (REVATIO) 20 MG tablet Take 20 mg by mouth as needed (Take 1-4 tablets 1 hour before activity as needed).  . sitaGLIPtin-metformin (JANUMET) 50-1000 MG tablet Take 1 tablet by mouth 2 (two) times daily with a meal.  . tamsulosin (FLOMAX) 0.4 MG CAPS capsule Take 0.4 mg by mouth daily.  Marland Kitchen telmisartan (MICARDIS) 80 MG tablet Take 80 mg by mouth daily.  Marland Kitchen telmisartan-hydrochlorothiazide (MICARDIS HCT) 80-12.5 MG tablet Take 1 tablet by mouth daily.  Marland Kitchen VASCEPA 1 g capsule Take 2 capsules (2,000 mg) by mouth 2 times per day with food  . Vitamin D, Ergocalciferol, (DRISDOL) 1.25 MG (50000 UNIT) CAPS capsule Take 50,000 Units by mouth once a week.     Allergies:   Patient has no known allergies.   Social History   Socioeconomic History  . Marital status: Married    Spouse name: Not on file  . Number of children: Not on file  . Years of education: Not on file  . Highest education level: Not on file  Occupational History  . Not on file  Tobacco Use  . Smoking status: Never Smoker  . Smokeless tobacco: Never Used  Substance and Sexual Activity  . Alcohol use: Never  . Drug use: Not on file  . Sexual activity: Not on file  Other Topics Concern  . Not on file  Social History Narrative  . Not on file   Social Determinants of Health   Financial Resource Strain:   . Difficulty of Paying Living Expenses: Not on file  Food Insecurity:   . Worried About Programme researcher, broadcasting/film/video in the Last Year: Not on file  . Ran Out of Food in the Last Year: Not on file  Transportation Needs:   . Lack of Transportation (Medical): Not on file  . Lack of Transportation (Non-Medical): Not on file  Physical Activity:   . Days of Exercise per Week: Not on file  . Minutes of Exercise per Session: Not on file  Stress:   . Feeling of Stress : Not on file  Social Connections:   .  Frequency of Communication with Friends and Family: Not on file  . Frequency of Social Gatherings with Friends and Family: Not on file  . Attends Religious Services: Not on file  . Active Member of Clubs or Organizations: Not on file  . Attends Banker Meetings: Not on file  . Marital Status: Not on file     Family History: The patient's family history includes Cancer in his mother; Colon cancer in his sister; Hemophilia in his brother; Liver disease in his father. ROS:   Please see the history of present illness.    All 14 point review  of systems negative except as described per history of present illness  EKGs/Labs/Other Studies Reviewed:      Recent Labs: No results found for requested labs within last 8760 hours.  Recent Lipid Panel No results found for: CHOL, TRIG, HDL, CHOLHDL, VLDL, LDLCALC, LDLDIRECT  Physical Exam:    VS:  BP (!) 146/80 (BP Location: Right Arm, Patient Position: Sitting)   Pulse 80   Ht 6\' 1"  (1.854 m)   Wt 228 lb (103.4 kg)   SpO2 98%   BMI 30.08 kg/m     Wt Readings from Last 3 Encounters:  03/02/20 228 lb (103.4 kg)  11/24/19 224 lb (101.6 kg)  09/15/19 220 lb (99.8 kg)     GEN:  Well nourished, well developed in no acute distress HEENT: Normal NECK: No JVD; No carotid bruits LYMPHATICS: No lymphadenopathy CARDIAC: RRR, no murmurs, no rubs, no gallops RESPIRATORY:  Clear to auscultation without rales, wheezing or rhonchi  ABDOMEN: Soft, non-tender, non-distended MUSCULOSKELETAL:  No edema; No deformity  SKIN: Warm and dry LOWER EXTREMITIES: 1+ pitting edema lower extremity NEUROLOGIC:  Alert and oriented x 3 PSYCHIATRIC:  Normal affect   ASSESSMENT:    1. Abnormal stress test   2. Atypical chest pain   3. Dyslipidemia   4. Hemophilia A (HCC)    PLAN:    In order of problems listed above:  1. Abnormal stress test plan was to do coronary CT angio.  We will investigate why it was not done and hopefully be able to do  the test.  Obviously if it would be very abnormal then cardiac catheterization will be considered.  He did not have need to take any nitroglycerin however I tell him if he will have some chest pain he need to take it. 2. Atypical chest pain plan as outlined above will do coronary CT angio before proceeding to cardiac catheterization because of history of hemophilia. 3. Dyslipidemia I will call primary care physician to get fasting lipid profile, he is already on Crestor 20 which I will continue. 4. He does have swelling of lower extremities which is a new finding, I will schedule him to have echocardiogram   Medication Adjustments/Labs and Tests Ordered: Current medicines are reviewed at length with the patient today.  Concerns regarding medicines are outlined above.  No orders of the defined types were placed in this encounter.  Medication changes: No orders of the defined types were placed in this encounter.   Signed, 09/17/19, MD, [facility] 03/02/2020 3:42 PM    [facility]

## 2020-03-02 NOTE — Addendum Note (Signed)
Addended by: Hazle Quant on: 03/02/2020 03:49 PM   Modules accepted: Orders

## 2020-03-02 NOTE — Patient Instructions (Signed)
Medication Instructions:  Your physician recommends that you continue on your current medications as directed. Please refer to the Current Medication list given to you today.  *If you need a refill on your cardiac medications before your next appointment, please call your pharmacy*   Lab Work: None.  If you have labs (blood work) drawn today and your tests are completely normal, you will receive your results only by: . MyChart Message (if you have MyChart) OR . A paper copy in the mail If you have any lab test that is abnormal or we need to change your treatment, we will call you to review the results.   Testing/Procedures: Your physician has requested that you have an echocardiogram. Echocardiography is a painless test that uses sound waves to create images of your heart. It provides your doctor with information about the size and shape of your heart and how well your heart's chambers and valves are working. This procedure takes approximately one hour. There are no restrictions for this procedure.     Follow-Up: At [facility], you and your health needs are our priority.  As part of our continuing mission to provide you with exceptional heart care, we have created designated Provider Care Teams.  These Care Teams include your primary Cardiologist (physician) and Advanced Practice Providers (APPs -  Physician Assistants and Nurse Practitioners) who all work together to provide you with the care you need, when you need it.  We recommend signing up for the patient portal called "MyChart".  Sign up information is provided on this After Visit Summary.  MyChart is used to connect with patients for Virtual Visits (Telemedicine).  Patients are able to view lab/test results, encounter notes, upcoming appointments, etc.  Non-urgent messages can be sent to your provider as well.   To learn more about what you can do with MyChart, go to [email or web address].    Your next appointment:   6  week(s)  The format for your next appointment:   In Person  Provider:   Robert Krasowski, MD   Other Instructions   Echocardiogram An echocardiogram is a procedure that uses painless sound waves (ultrasound) to produce an image of the heart. Images from an echocardiogram can provide important information about:  Signs of coronary artery disease (CAD).  Aneurysm detection. An aneurysm is a weak or damaged part of an artery wall that bulges out from the normal force of blood pumping through the body.  Heart size and shape. Changes in the size or shape of the heart can be associated with certain conditions, including heart failure, aneurysm, and CAD.  Heart muscle function.  Heart valve function.  Signs of a past heart attack.  Fluid buildup around the heart.  Thickening of the heart muscle.  A tumor or infectious growth around the heart valves. Tell a health care provider about:  Any allergies you have.  All medicines you are taking, including vitamins, herbs, eye drops, creams, and over-the-counter medicines.  Any blood disorders you have.  Any surgeries you have had.  Any medical conditions you have.  Whether you are pregnant or may be pregnant. What are the risks? Generally, this is a safe procedure. However, problems may occur, including:  Allergic reaction to dye (contrast) that may be used during the procedure. What happens before the procedure? No specific preparation is needed. You may eat and drink normally. What happens during the procedure?   An IV tube may be inserted into one of your veins.  You may   receive contrast through this tube. A contrast is an injection that improves the quality of the pictures from your heart.  A gel will be applied to your chest.  A wand-like tool (transducer) will be moved over your chest. The gel will help to transmit the sound waves from the transducer.  The sound waves will harmlessly bounce off of your heart to  allow the heart images to be captured in real-time motion. The images will be recorded on a computer. The procedure may vary among health care providers and hospitals. What happens after the procedure?  You may return to your normal, everyday life, including diet, activities, and medicines, unless your health care provider tells you not to do that. Summary  An echocardiogram is a procedure that uses painless sound waves (ultrasound) to produce an image of the heart.  Images from an echocardiogram can provide important information about the size and shape of your heart, heart muscle function, heart valve function, and fluid buildup around your heart.  You do not need to do anything to prepare before this procedure. You may eat and drink normally.  After the echocardiogram is completed, you may return to your normal, everyday life, unless your health care provider tells you not to do that. This information is not intended to replace advice given to you by your health care provider. Make sure you discuss any questions you have with your health care provider. Document Revised: 07/10/2018 Document Reviewed: 04/21/2016 Elsevier Patient Education  2020 Elsevier Inc.   

## 2020-03-16 LAB — BASIC METABOLIC PANEL
BUN/Creatinine Ratio: 29 — ABNORMAL HIGH (ref 10–24)
BUN: 32 mg/dL — ABNORMAL HIGH (ref 8–27)
CO2: 25 mmol/L (ref 20–29)
Calcium: 10.1 mg/dL (ref 8.6–10.2)
Chloride: 101 mmol/L (ref 96–106)
Creatinine, Ser: 1.11 mg/dL (ref 0.76–1.27)
GFR calc Af Amer: 82 mL/min/{1.73_m2} (ref >59)
GFR calc non Af Amer: 71 mL/min/{1.73_m2} (ref >59)
Glucose: 146 mg/dL — ABNORMAL HIGH (ref 65–99)
Potassium: 4.5 mmol/L (ref 3.5–5.2)
Sodium: 142 mmol/L (ref 134–144)

## 2020-03-21 ENCOUNTER — Telehealth (HOSPITAL_COMMUNITY): Payer: Self-pay | Admitting: Emergency Medicine

## 2020-03-21 NOTE — Telephone Encounter (Signed)
Reaching out to patient to offer assistance regarding upcoming cardiac imaging study; pt verbalizes understanding of appt date/time, parking situation and where to check in, pre-test NPO status and medications ordered, and verified current allergies; name and call back number provided for further questions should they arise Rockwell Alexandria RN Navigator Cardiac Imaging [facility] [phone] office [phone] cell   Pt instructed to take 50mg  metoprolol succinate 2 hr prior to scan. Avoiding inhaler, xyzal, sildenafil, HCTZ 

## 2020-03-23 ENCOUNTER — Ambulatory Visit (HOSPITAL_COMMUNITY)
Admission: RE | Admit: 2020-03-23 | Discharge: 2020-03-23 | Disposition: A | Discharge status: Home / Self Care | Payer: PRIVATE HEALTH INSURANCE | Source: Ambulatory Visit | Attending: Cardiology | Admitting: Cardiology

## 2020-03-23 ENCOUNTER — Other Ambulatory Visit: Payer: Self-pay

## 2020-03-23 DIAGNOSIS — R072 Precordial pain: Secondary | ICD-10-CM | POA: Insufficient documentation

## 2020-03-23 DIAGNOSIS — Z006 Encounter for examination for normal comparison and control in clinical research program: Secondary | ICD-10-CM

## 2020-03-23 MED ORDER — IOHEXOL 350 MG/ML SOLN
80.0000 mL | Freq: Once | INTRAVENOUS | Status: AC | PRN
  Administered 2020-03-23: 80 mL via INTRAVENOUS

## 2020-03-23 MED ORDER — NITROGLYCERIN 0.4 MG SL SUBL
0.8000 mg | SUBLINGUAL_TABLET | Freq: Once | SUBLINGUAL | Status: AC
  Administered 2020-03-23: 12:00:00 0.8 mg via SUBLINGUAL

## 2020-03-23 MED ORDER — NITROGLYCERIN 0.4 MG SL SUBL
SUBLINGUAL_TABLET | SUBLINGUAL | Status: AC
  Filled 2020-03-23: qty 2

## 2020-03-23 MED ORDER — METOPROLOL TARTRATE 5 MG/5ML IV SOLN
INTRAVENOUS | Status: AC
  Filled 2020-03-23: qty 15

## 2020-03-23 MED ORDER — METOPROLOL TARTRATE 5 MG/5ML IV SOLN
5.0000 mg | INTRAVENOUS | Status: DC | PRN
  Administered 2020-03-23 (×2): 5 mg via INTRAVENOUS

## 2020-03-23 NOTE — Research (Signed)
IDENTIFY Informed Consent   Subject Name: Samuel Dunn [facility]  Subject met inclusion and exclusion criteria.  The informed consent form, study requirements and expectations were reviewed with the subject and questions and concerns were addressed prior to the signing of the consent form.  The subject verbalized understanding of the trial requirements.  The subject agreed to participate in the IDENTIFYtrial and signed the informed consent on 22/DEC/2021 at 1054.  The informed consent was obtained prior to performance of any protocol-specific procedures for the subject.  A copy of the signed informed consent was given to the subject and a copy was placed in the subject's medical record.   [name]

## 2020-03-28 ENCOUNTER — Other Ambulatory Visit: Payer: Self-pay

## 2020-03-28 ENCOUNTER — Ambulatory Visit (INDEPENDENT_AMBULATORY_CARE_PROVIDER_SITE_OTHER): Payer: PRIVATE HEALTH INSURANCE

## 2020-03-28 DIAGNOSIS — R0789 Other chest pain: Secondary | ICD-10-CM | POA: Diagnosis not present

## 2020-03-28 DIAGNOSIS — R9439 Abnormal result of other cardiovascular function study: Secondary | ICD-10-CM

## 2020-03-28 LAB — ECHOCARDIOGRAM COMPLETE
Area-P 1/2: 3.31 cm2
S' Lateral: 3.2 cm

## 2020-03-28 NOTE — Progress Notes (Signed)
Complete echocardiogram performed.  Jimmy Shean Gerding RDCS, RVT  

## 2020-03-29 ENCOUNTER — Telehealth: Payer: Self-pay

## 2020-03-29 NOTE — Telephone Encounter (Signed)
Spoke with patient regarding results and recommendation.  Patient verbalizes understanding and is agreeable to plan of care. Advised patient to call back with any issues or concerns.  

## 2020-03-29 NOTE — Telephone Encounter (Signed)
Patient notified of results and voiced understanding. He also is aware of his upcoming appt.

## 2020-03-29 NOTE — Telephone Encounter (Signed)
-----   Message from Georgeanna Lea, MD sent at 03/28/2020  9:43 AM EST ----- Coronary CT angios is normal without any significant coronary artery disease

## 2020-03-29 NOTE — Telephone Encounter (Signed)
-----   Message from Georgeanna Lea, MD sent at 03/29/2020 12:03 PM EST ----- Echocardiogram showed preserved left ventricle ejection fraction, left atrium mildly enlarged, overall looks good

## 2020-04-12 ENCOUNTER — Other Ambulatory Visit: Payer: Self-pay

## 2020-04-12 ENCOUNTER — Ambulatory Visit: Payer: PRIVATE HEALTH INSURANCE | Admitting: Cardiology

## 2020-04-12 ENCOUNTER — Encounter: Payer: Self-pay | Admitting: Cardiology

## 2020-04-12 VITALS — BP 124/72 | HR 81 | Ht 73.0 in | Wt 234.0 lb

## 2020-04-12 DIAGNOSIS — I10 Essential (primary) hypertension: Secondary | ICD-10-CM

## 2020-04-12 DIAGNOSIS — I471 Supraventricular tachycardia: Secondary | ICD-10-CM

## 2020-04-12 DIAGNOSIS — R0789 Other chest pain: Secondary | ICD-10-CM | POA: Diagnosis not present

## 2020-04-12 DIAGNOSIS — R9439 Abnormal result of other cardiovascular function study: Secondary | ICD-10-CM

## 2020-04-12 DIAGNOSIS — D66 Hereditary factor VIII deficiency: Secondary | ICD-10-CM

## 2020-04-12 NOTE — Progress Notes (Signed)
Cardiology Office Note:    Date:  04/12/2020   ID:  Samuel Dunn, DOB 18-Sep-1960, MRN [id number]  PCP:  Abner Greenspan, MD  Cardiologist:  Gypsy Balsam, MD    Referring MD: Abner Greenspan, MD   Chief Complaint  Patient presents with  . Follow-up  I am doing fine  History of Present Illness:    Samuel Dunn is a 60 y.o. male with past medical history significant for essential hypertension, diabetes, hemophilia, status post splenectomy he was referred to [place] because of atypical chest pain, stress test done after that showed some questionable areas of ischemia however because of high risk cardiac catheterization we elected not to pursue cardiac catheterization to coronary CT angio.  Coronary CT angios showed calcium score of 0 and there was no significant obstructive coronary artery disease.  He comes today 2 months follow-up overall he is doing well denies having any chest pain tightness squeezing pressure burning chest.  His chest pain was very atypical from the beginning.  It typically happens at rest.  At the same time he is able to collect wood in his backyard and have no difficulty doing it.  Now he is calcium score is 0 coronary CT showed no disease.  It is encouraging.  Past Medical History:  Diagnosis Date  . Abnormal stress test 08/18/2019  . Allergic rhinitis   . Atypical chest pain 08/03/2019  . 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 Benign prostatic hyperplasia without lower urinary tract symptoms   . Chronic hepatitis C (HCC)   . Diabetes mellitus (HCC) 08/03/2019  . Dyslipidemia 08/03/2019  . ED (erectile dysfunction)   . Erythrocytosis 08/03/2019  . Hematoma 10/08/2012  . 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 Hereditary spherocytosis (HCC) 08/03/2019  . Hyperlipidemia 08/03/2019  . Hypertension 08/03/2019  . Hypogonadism male   . Insomnia   . Paroxysmal tachycardia, unspecified (HCC)   . 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 SVT (supraventricular tachycardia)  (HCC) 08/03/2019  . Ventricular premature depolarization   . Vitamin D deficiency     Past Surgical History:  Procedure Laterality Date  . SPLENECTOMY, TOTAL      Current Medications: Current Meds  Medication Sig  . albuterol (VENTOLIN HFA) 108 (90 Base) MCG/ACT inhaler Inhale 2 puffs into the lungs every 4 (four) hours as needed for wheezing or shortness of breath.  Marland Kitchen amoxicillin (AMOXIL) 875 MG tablet Take 1 tablet by mouth in the morning and at bedtime.  Marland Kitchen azaTHIOprine (IMURAN) 50 MG tablet Take 50 mg by mouth daily.  Marland Kitchen azelastine (ASTELIN) 0.1 % nasal spray Place 2 sprays into both nostrils 2 (two) times daily.  . citalopram (CELEXA) 10 MG tablet Take 10 mg by mouth daily.  Marland Kitchen FARXIGA 10 MG TABS tablet Take 10 mg by mouth daily.  . fenofibrate 160 MG tablet Take 160 mg by mouth daily.  Marland Kitchen levocetirizine (XYZAL) 5 MG tablet 5 mg every evening. As Needed  . metoprolol succinate (TOPROL-XL) 50 MG 24 hr tablet Take 1 tablet (50 mg total) by mouth daily. Take with or immediately following a meal.  . nitroGLYCERIN (NITROSTAT) 0.4 MG SL tablet Place 1 tablet (0.4 mg total) under the tongue every 5 (five) minutes as needed for chest pain.  . Omega-3 Fatty Acids (FISH OIL) 1000 MG CAPS Take by mouth 2 (two) times daily.  . pioglitazone (ACTOS) 15 MG tablet Take 15 mg by mouth daily.  . ranolazine (RANEXA) 500 MG 12 hr tablet  TAKE ONE TABLET BY MOUTH TWICE DAILY  . rosuvastatin (CRESTOR) 20 MG tablet Take 20 mg by mouth daily.  . sitaGLIPtin-metformin (JANUMET) 50-1000 MG tablet Take 1 tablet by mouth 2 (two) times daily with a meal.  . tamsulosin (FLOMAX) 0.4 MG CAPS capsule Take 0.4 mg by mouth daily.  Marland Kitchen telmisartan (MICARDIS) 80 MG tablet Take 80 mg by mouth daily.  Marland Kitchen telmisartan-hydrochlorothiazide (MICARDIS HCT) 80-12.5 MG tablet Take 1 tablet by mouth daily.  . Vitamin D, Ergocalciferol, (DRISDOL) 1.25 MG (50000 UNIT) CAPS capsule Take 50,000 Units by mouth once a week.  . [DISCONTINUED]  dapagliflozin propanediol (FARXIGA) 10 MG TABS tablet Take 10 mg by mouth daily.     Allergies:   Patient has no known allergies.   Social History   Socioeconomic History  . Marital status: Married    Spouse name: Not on file  . Number of children: Not on file  . Years of education: Not on file  . Highest education level: Not on file  Occupational History  . Not on file  Tobacco Use  . Smoking status: Never Smoker  . Smokeless tobacco: Never Used  Substance and Sexual Activity  . Alcohol use: Never  . Drug use: Not on file  . Sexual activity: Not on file  Other Topics Concern  . Not on file  Social History Narrative  . Not on file   Social Determinants of Health   Financial Resource Strain: Not on file  Food Insecurity: Not on file  Transportation Needs: Not on file  Physical Activity: Not on file  Stress: Not on file  Social Connections: Not on file     Family History: The patient's family history includes Cancer in his mother; Colon cancer in his sister; Hemophilia in his brother; Liver disease in his father. ROS:   Please see the history of present illness.    All 14 point review of systems negative except as described per history of present illness  EKGs/Labs/Other Studies Reviewed:      Recent Labs: 03/15/2020: BUN 32; Creatinine, Ser 1.11; Potassium 4.5; Sodium 142  Recent Lipid Panel No results found for: CHOL, TRIG, HDL, CHOLHDL, VLDL, LDLCALC, LDLDIRECT  Physical Exam:    VS:  BP 124/72 (BP Location: Left Arm, Patient Position: Sitting)   Pulse 81   Ht 6\' 1"  (1.854 m)   Wt 234 lb (106.1 kg)   SpO2 97%   BMI 30.87 kg/m     Wt Readings from Last 3 Encounters:  04/12/20 234 lb (106.1 kg)  03/02/20 228 lb (103.4 kg)  11/24/19 224 lb (101.6 kg)     GEN:  Well nourished, well developed in no acute distress HEENT: Normal NECK: No JVD; No carotid bruits LYMPHATICS: No lymphadenopathy CARDIAC: RRR, no murmurs, no rubs, no gallops RESPIRATORY:   Clear to auscultation without rales, wheezing or rhonchi  ABDOMEN: Soft, non-tender, non-distended MUSCULOSKELETAL:  No edema; No deformity  SKIN: Warm and dry LOWER EXTREMITIES: no swelling NEUROLOGIC:  Alert and oriented x 3 PSYCHIATRIC:  Normal affect   ASSESSMENT:    1. Abnormal stress test   2. Atypical chest pain   3. SVT (supraventricular tachycardia) (HCC)   4. Primary hypertension   5. Hemophilia (HCC)    PLAN:    In order of problems listed above:  1. Abnormal stress test but coronary CT angio normal after that.  We will drop the issue of obstructive coronary artery disease.  He is receptors modifications which are already doing.  2. Atypical chest pain denies having any. 3. History of SVT denies have any palpitation lately. 4. Essential hypertension well-controlled he described to have some orthostatic hypotension, will discontinue his hydrochlorothiazide. 5. Hemophilia: Followed by internal medicine team. 6. Dyslipidemia will call city nurse who does his cholesterol every 3 months.  He is on statin which I will continue.   Medication Adjustments/Labs and Tests Ordered: Current medicines are reviewed at length with the patient today.  Concerns regarding medicines are outlined above.  No orders of the defined types were placed in this encounter.  Medication changes: No orders of the defined types were placed in this encounter.   Signed, Georgeanna Lea, MD, [facility] 04/12/2020 9:16 AM    [facility]

## 2020-04-12 NOTE — Patient Instructions (Signed)
Medication Instructions:  Your physician has recommended you make the following change in your medication:Stop hydrochlorothiazide *If you need a refill on your cardiac medications before your next appointment, please call your pharmacy*   Lab Work: None If you have labs (blood work) drawn today and your tests are completely normal, you will receive your results only by: Marland Kitchen MyChart Message (if you have MyChart) OR . A paper copy in the mail If you have any lab test that is abnormal or we need to change your treatment, we will call you to review the results.   Testing/Procedures: None   Follow-Up: At [facility], you and your health needs are our priority.  As part of our continuing mission to provide you with exceptional heart care, we have created designated Provider Care Teams.  These Care Teams include your primary Cardiologist (physician) and Advanced Practice Providers (APPs -  Physician Assistants and Nurse Practitioners) who all work together to provide you with the care you need, when you need it.  We recommend signing up for the patient portal called "MyChart".  Sign up information is provided on this After Visit Summary.  MyChart is used to connect with patients for Virtual Visits (Telemedicine).  Patients are able to view lab/test results, encounter notes, upcoming appointments, etc.  Non-urgent messages can be sent to your provider as well.   To learn more about what you can do with MyChart, go to [email or web address].    Your next appointment:   6 month(s)  The format for your next appointment:   In Person  Provider:   Gypsy Balsam, MD   Other Instructions

## 2020-05-04 ENCOUNTER — Other Ambulatory Visit: Payer: Self-pay | Admitting: Cardiology

## 2020-06-21 ENCOUNTER — Telehealth: Payer: Self-pay | Admitting: Cardiology

## 2020-06-21 NOTE — Telephone Encounter (Signed)
Nurse Santo Held called to get Dr Bing Matter notes on patient office visit on 03/02/2020. Please fax info over [phone]

## 2020-06-21 NOTE — Telephone Encounter (Signed)
Spoke to [place], this is the patient's primary care, will fax over last office visit notes.

## 2020-08-12 ENCOUNTER — Telehealth: Payer: Self-pay

## 2020-08-12 NOTE — Telephone Encounter (Signed)
I called patient for his 90-day Identify Study follow up phone call. Patient is doing well with no cardiac symptoms at this time. I reminded patient I would call him in Dec. for his 1 year follow-up.

## 2020-08-23 ENCOUNTER — Other Ambulatory Visit: Payer: Self-pay | Admitting: Cardiology

## 2021-01-26 ENCOUNTER — Other Ambulatory Visit: Payer: Self-pay

## 2021-01-26 ENCOUNTER — Ambulatory Visit: Payer: No Typology Code available for payment source | Admitting: Cardiology

## 2021-01-26 ENCOUNTER — Encounter: Payer: Self-pay | Admitting: Cardiology

## 2021-01-26 VITALS — BP 130/84 | HR 62 | Ht 73.0 in | Wt 226.6 lb

## 2021-01-26 DIAGNOSIS — I4510 Unspecified right bundle-branch block: Secondary | ICD-10-CM

## 2021-01-26 DIAGNOSIS — E119 Type 2 diabetes mellitus without complications: Secondary | ICD-10-CM

## 2021-01-26 DIAGNOSIS — R0789 Other chest pain: Secondary | ICD-10-CM | POA: Diagnosis not present

## 2021-01-26 DIAGNOSIS — I10 Essential (primary) hypertension: Secondary | ICD-10-CM

## 2021-01-26 NOTE — Patient Instructions (Signed)
Medication Instructions:  Your physician has recommended you make the following change in your medication:  STOP: Ranexa   *If you need a refill on your cardiac medications before your next appointment, please call your pharmacy*   Lab Work: None If you have labs (blood work) drawn today and your tests are completely normal, you will receive your results only by: MyChart Message (if you have MyChart) OR A paper copy in the mail If you have any lab test that is abnormal or we need to change your treatment, we will call you to review the results.   Testing/Procedures: None   Follow-Up: At [facility], you and your health needs are our priority.  As part of our continuing mission to provide you with exceptional heart care, we have created designated Provider Care Teams.  These Care Teams include your primary Cardiologist (physician) and Advanced Practice Providers (APPs -  Physician Assistants and Nurse Practitioners) who all work together to provide you with the care you need, when you need it.  We recommend signing up for the patient portal called "MyChart".  Sign up information is provided on this After Visit Summary.  MyChart is used to connect with patients for Virtual Visits (Telemedicine).  Patients are able to view lab/test results, encounter notes, upcoming appointments, etc.  Non-urgent messages can be sent to your provider as well.   To learn more about what you can do with MyChart, go to [email or web address].    Your next appointment:   1 year(s)  The format for your next appointment:   In Person  Provider:   Gypsy Balsam, MD   Other Instructions

## 2021-01-26 NOTE — Progress Notes (Signed)
Cardiology Office Note:    Date:  01/26/2021   ID:  Samuel Dunn, DOB [birth year], MRN [id number]  PCP:  Abner Greenspan, MD  Cardiologist:  Gypsy Balsam, MD    Referring MD: Abner Greenspan, MD   Chief Complaint  Patient presents with   Follow-up  Doing very well  History of Present Illness:    Samuel Dunn is a 60 y.o. male  with past medical history significant for essential hypertension, diabetes, hemophilia, status post splenectomy he was referred to [place] because of atypical chest pain, stress test done after that showed some questionable areas of ischemia however because of high risk cardiac catheterization we elected not to pursue cardiac catheterization to coronary CT angio.  Coronary CT angios showed calcium score of 0 and there was no significant obstructive coronary artery disease.  He comes today to my office for follow-up overall he is doing very well.  Denies have any chest pain tightness squeezing pressure burning chest.  Still very active and have no difficulty doing things. 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   citalopram (CELEXA) 10 MG tablet Take 10 mg by mouth daily.   FARXIGA 10 MG TABS tablet Take 10 mg by mouth daily.   fenofibrate 160 MG tablet Take 160 mg by mouth daily.   icosapent Ethyl (VASCEPA) 1 g capsule Take 2 g by mouth 2 (two) times daily.   metoprolol succinate (TOPROL-XL) 50 MG 24 hr tablet TAKE ONE TABLET BY MOUTH EVERY DAY (Patient taking differently: Take 50 mg by mouth daily.)   nitroGLYCERIN (NITROSTAT) 0.4 MG SL tablet Place 1 tablet (0.4 mg total) under the tongue every 5 (five) minutes as needed for chest pain.   Omega-3 Fatty Acids (FISH OIL) 1000 MG CAPS Take 1 capsule by mouth 2 (two) times daily.   ranolazine (RANEXA) 500 MG 12 hr tablet TAKE ONE TABLET BY MOUTH TWICE DAILY (Patient taking differently: Take 500 mg by mouth 2 (two) times daily.)   rosuvastatin (CRESTOR) 20 MG tablet Take 20 mg by mouth daily.   sitaGLIPtin-metformin (JANUMET) 50-1000 MG tablet Take 1 tablet by mouth 2 (two) times daily with a meal.   tamsulosin (FLOMAX) 0.4 MG CAPS capsule Take 0.4 mg by mouth daily.   telmisartan (MICARDIS) 80 MG tablet Take 80 mg by mouth daily.   Vitamin D, Ergocalciferol, (DRISDOL) 1.25 MG (50000 UNIT) CAPS capsule Take 50,000 Units by mouth once a week.   [DISCONTINUED] albuterol (VENTOLIN HFA) 108 (90 Base) MCG/ACT inhaler Inhale 2  puffs into the lungs every 4 (four) hours as needed for wheezing or shortness of breath.   [DISCONTINUED] VASCEPA 1 g capsule Take 2 g by mouth 2 (two) times daily.     Allergies:   Patient has no known allergies.   Social History   Socioeconomic History   Marital status: Married    Spouse name: Not on file   Number of children: Not on file   Years of education: Not on file   Highest education level: Not on file  Occupational History   Not on file  Tobacco Use   Smoking status: Never   Smokeless tobacco: Never  Substance and Sexual Activity   Alcohol use:  Never   Drug use: Not on file   Sexual activity: Not on file  Other Topics Concern   Not on file  Social History Narrative   Not on file   Social Determinants of Health   Financial Resource Strain: Not on file  Food Insecurity: Not on file  Transportation Needs: Not on file  Physical Activity: Not on file  Stress: Not on file  Social Connections: Not on file     Family History: The patient's family history includes Cancer in his mother; Colon cancer in his sister; Hemophilia in his brother; Liver disease in his father. ROS:   Please see the history of present illness.    All 14 point review of systems negative except as described per history of present illness  EKGs/Labs/Other Studies Reviewed:      Recent Labs: 03/15/2020: BUN 32; Creatinine, Ser 1.11; Potassium 4.5; Sodium 142  Recent Lipid Panel No results found for: CHOL, TRIG, HDL, CHOLHDL, VLDL, LDLCALC, LDLDIRECT  Physical Exam:    VS:  BP 130/84 (BP Location: Left Arm, Patient Position: Sitting)   Pulse 62   Ht 6\' 1"  (1.854 m)   Wt 226 lb 9.6 oz (102.8 kg)   SpO2 95%   BMI 29.90 kg/m     Wt Readings from Last 3 Encounters:  01/26/21 226 lb 9.6 oz (102.8 kg)  04/12/20 234 lb (106.1 kg)  03/02/20 228 lb (103.4 kg)     GEN:  Well nourished, well developed in no acute distress HEENT: Normal NECK: No JVD; No carotid bruits LYMPHATICS: No lymphadenopathy CARDIAC: RRR, no murmurs, no rubs, no gallops RESPIRATORY:  Clear to auscultation without rales, wheezing or rhonchi  ABDOMEN: Soft, non-tender, non-distended MUSCULOSKELETAL:  No edema; No deformity  SKIN: Warm and dry LOWER EXTREMITIES: no swelling NEUROLOGIC:  Alert and oriented x 3 PSYCHIATRIC:  Normal affect   ASSESSMENT:    1. Primary hypertension   2. Type 2 diabetes mellitus without complication, without long-term current use of insulin (HCC)   3. Atypical chest pain   4. Right bundle branch block    PLAN:    In order of problems listed  above:  Essential hypertension blood pressure well controlled continue present management. Type 2 diabetes he tells me that his hemoglobin A1c is 7.1 which is much better than before he is active and trying to stick with a good diet. Atypical chest pain denies having any. Right bundle branch block which is chronic He did have abnormal stress test but after that coronary CT angio did not show any obstructive disease.  We will discontinue ranolazine   Medication Adjustments/Labs and Tests Ordered: Current medicines are reviewed at length with the patient today.  Concerns regarding medicines are outlined above.  No orders of the defined types were placed in  this encounter.  Medication changes: No orders of the defined types were placed in this encounter.   Signed, Georgeanna Lea, MD, [facility] 01/26/2021 3:25 PM    [facility]

## 2021-02-04 ENCOUNTER — Other Ambulatory Visit: Payer: Self-pay | Admitting: Cardiology

## 2021-04-03 ENCOUNTER — Other Ambulatory Visit: Payer: Self-pay | Admitting: Cardiology

## 2021-12-31 ENCOUNTER — Other Ambulatory Visit: Payer: Self-pay | Admitting: Cardiology

## 2022-02-07 ENCOUNTER — Other Ambulatory Visit: Payer: Self-pay | Admitting: Cardiology

## 2022-02-16 IMAGING — CT CT HEART MORP W/ CTA COR W/ SCORE W/ CA W/CM &/OR W/O CM
4 of 7 series · 8 of 20 positions shown, 9 images · IV contrast (APPLIED)
Comparison: None.
COMPARISON: None.

Addendum:
EXAM:
OVER-READ INTERPRETATION  CT CHEST

The following report is an over-read performed by radiologist Dr.
Aisha Humaira Vero [REDACTED] on 03/23/2020. This
over-read does not include interpretation of cardiac or coronary
anatomy or pathology. The coronary calcium score/coronary CTA
interpretation by the cardiologist is attached.
CLINICAL DATA: 59 year old male with abnormal stress test.
Cardiac/Coronary  CT
TECHNIQUE: The patient was scanned on a [employer] Force scanner.

[Series 6: best diast 72 % · axial · 0.43mm/px · z∈[+1308,+1344]mm · 2 of 271 slices shown]
[im 91/271  vessel]
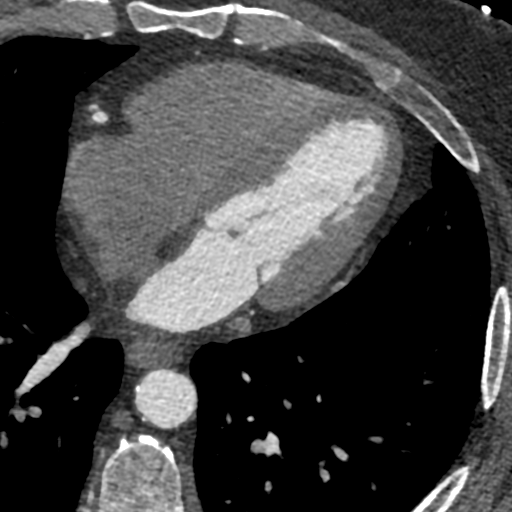
[im 181/271  vessel]
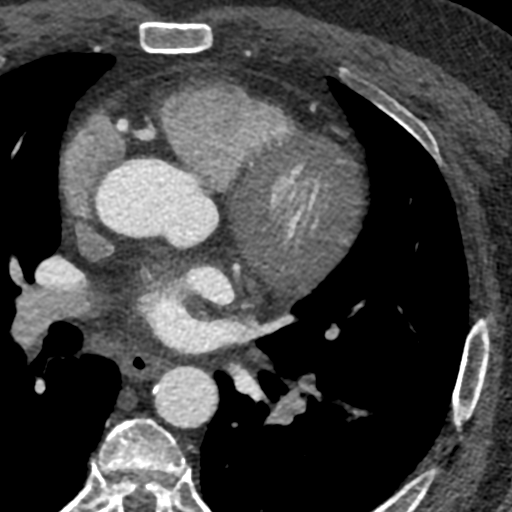

[Series 7: best syst · axial · 0.43mm/px · z∈[+1308,+1344]mm · 2 of 271 slices shown, 3 images]
[im 91/271  vessel]
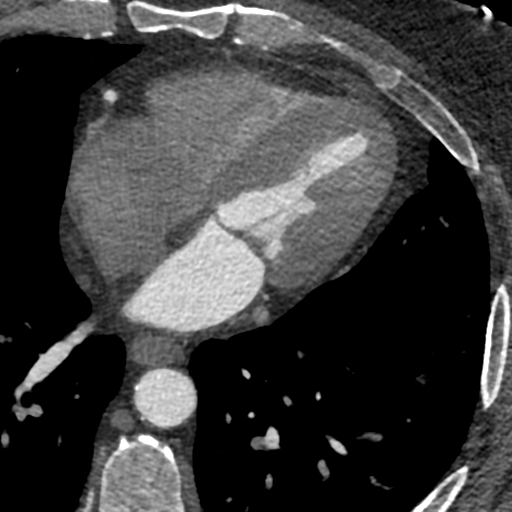
[im 91/271  lung]
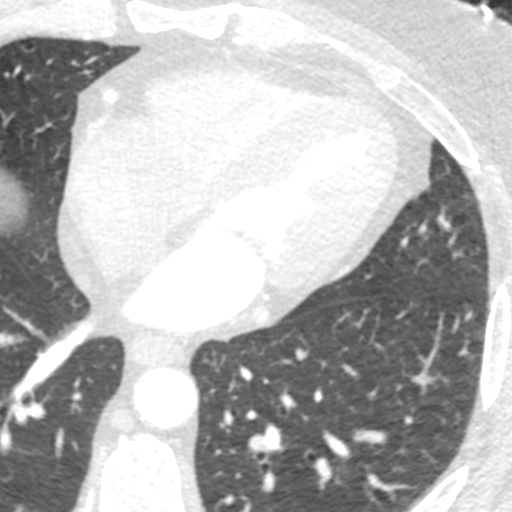
[im 181/271  vessel]
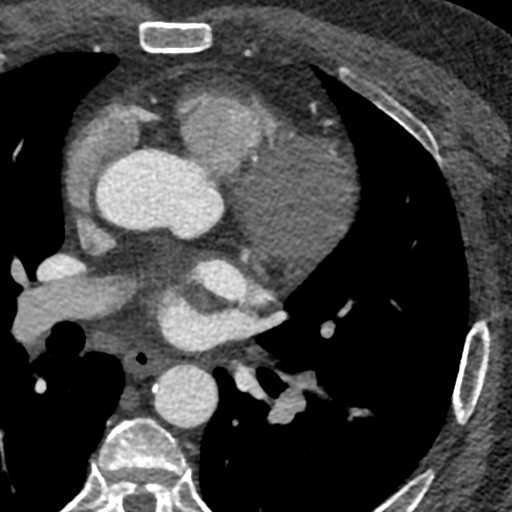

[Series 8: ts diast sharp 72 % · axial · 0.43mm/px · z∈[+1308,+1344]mm · 2 of 271 slices shown]
[im 91/271  lung]
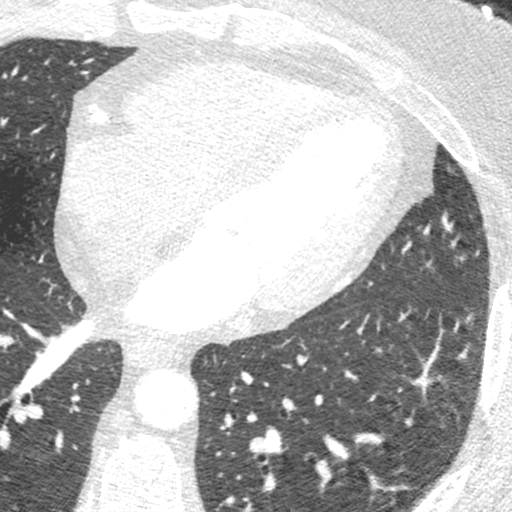
[im 181/271  lung]
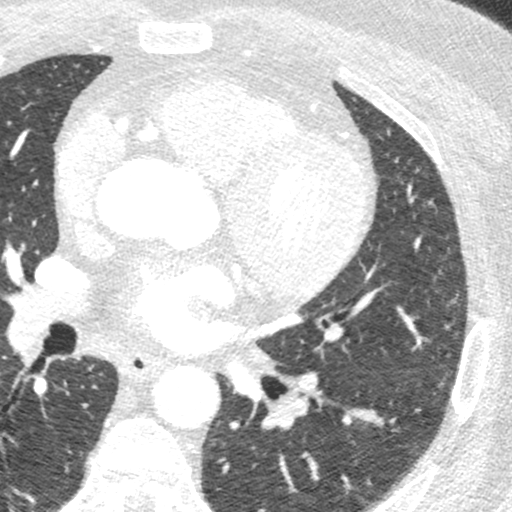

[Series 9: ts syst sharp 33 % · axial · 0.43mm/px · z∈[+1308,+1344]mm · 2 of 271 slices shown]
[im 91/271  lung]
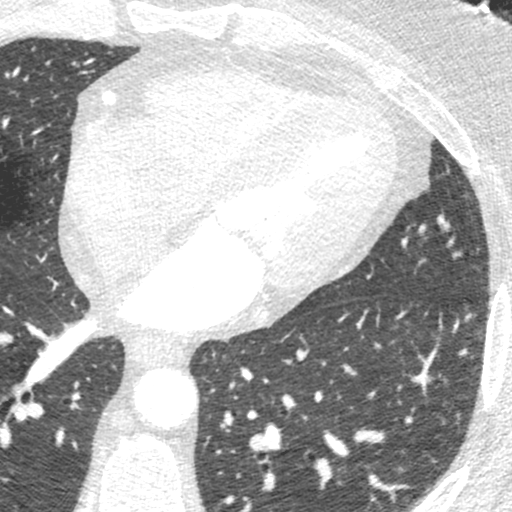
[im 181/271  lung]
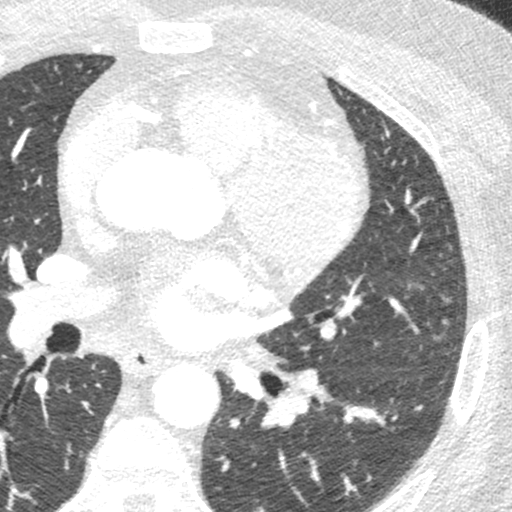

[8 of 20 positions shown; findings below may reference images not displayed]

FINDINGS: Vascular: Pulmonic trunk is enlarged as is the heart. No pericardial
effusion.

Mediastinum/Nodes: None.

Lungs/Pleura: Visualized lungs are clear.  No pleural fluid.

Upper Abdomen: None.

Musculoskeletal: Degenerative changes in the spine. No worrisome
lytic or sclerotic lesions.
IMPRESSION: Enlarged pulmonic trunk, indicative of pulmonary arterial
hypertension.
FINDINGS: A 120 kV prospective scan was triggered in the descending thoracic
aorta at 111 HU's. Axial non-contrast 3 mm slices were carried out
through the heart. The data set was analyzed on a dedicated work
station and scored using the Agatson method. Gantry rotation speed
was 250 msecs and collimation was .6 mm. No beta blockade and 0.8 mg
of sl NTG was given. The 3D data set was reconstructed in 5%
intervals of the 67-82 % of the R-R cycle. Diastolic phases were
analyzed on a dedicated work station using MPR, MIP and VRT modes.
The patient received 80 cc of contrast.

Aorta: Normal size.  No calcifications.  No dissection.

Aortic Valve:  Trileaflet.  No calcifications.

Coronary Arteries:  Normal coronary origin.  Right dominance.

RCA is a large torturous dominant artery that gives rise to PDA and
PLVB. There is no plaque.

Left main is a large artery that gives rise to LAD, intermedius
ramus and LCX arteries.

LAD is a large vessel that has no plaque.

Intermedius ramus with no plaques.

LCX is a non-dominant artery that gives rise to one large OM1
branch. There is no plaque.

Other findings:

Normal pulmonary vein drainage into the left atrium.

Normal left atrial appendage without a thrombus.

Normal size of the pulmonary artery.
IMPRESSION: 1. Coronary calcium score of 0. This was 0 percentile for age and
sex matched control.

2. Normal coronary origin with right dominance.

3. No evidence of CAD.

Paulus N Ceejay, DO

*** End of Addendum ***
EXAM:
OVER-READ INTERPRETATION  CT CHEST

The following report is an over-read performed by radiologist Dr.
Aisha Humaira Vero [REDACTED] on 03/23/2020. This
over-read does not include interpretation of cardiac or coronary
anatomy or pathology. The coronary calcium score/coronary CTA
interpretation by the cardiologist is attached.
FINDINGS: Vascular: Pulmonic trunk is enlarged as is the heart. No pericardial
effusion.

Mediastinum/Nodes: None.

Lungs/Pleura: Visualized lungs are clear.  No pleural fluid.

Upper Abdomen: None.

Musculoskeletal: Degenerative changes in the spine. No worrisome
lytic or sclerotic lesions.
IMPRESSION: Enlarged pulmonic trunk, indicative of pulmonary arterial
hypertension.

## 2022-03-06 ENCOUNTER — Other Ambulatory Visit: Payer: Self-pay | Admitting: Cardiology

## 2022-04-23 ENCOUNTER — Other Ambulatory Visit: Payer: Self-pay | Admitting: Cardiology

## 2022-04-23 NOTE — Telephone Encounter (Signed)
Rx refill sent to pharmacy. 

## 2022-05-16 LAB — LAB REPORT - SCANNED
A1c: 7.6
EGFR: 105

## 2022-05-21 ENCOUNTER — Other Ambulatory Visit: Payer: Self-pay | Admitting: Cardiology

## 2022-06-18 ENCOUNTER — Other Ambulatory Visit: Payer: Self-pay

## 2022-06-19 ENCOUNTER — Other Ambulatory Visit: Payer: Self-pay | Admitting: Cardiology

## 2022-06-20 ENCOUNTER — Encounter: Payer: Self-pay | Admitting: Cardiology

## 2022-06-20 ENCOUNTER — Ambulatory Visit: Payer: Managed Care, Other (non HMO) | Attending: Cardiology | Admitting: Cardiology

## 2022-06-20 VITALS — BP 100/70 | HR 98 | Ht 73.0 in | Wt 230.0 lb

## 2022-06-20 DIAGNOSIS — D580 Hereditary spherocytosis: Secondary | ICD-10-CM | POA: Diagnosis not present

## 2022-06-20 DIAGNOSIS — I471 Supraventricular tachycardia, unspecified: Secondary | ICD-10-CM

## 2022-06-20 DIAGNOSIS — E785 Hyperlipidemia, unspecified: Secondary | ICD-10-CM

## 2022-06-20 DIAGNOSIS — I4510 Unspecified right bundle-branch block: Secondary | ICD-10-CM | POA: Diagnosis not present

## 2022-06-20 DIAGNOSIS — G4733 Obstructive sleep apnea (adult) (pediatric): Secondary | ICD-10-CM

## 2022-06-20 DIAGNOSIS — I10 Essential (primary) hypertension: Secondary | ICD-10-CM

## 2022-06-20 DIAGNOSIS — R0609 Other forms of dyspnea: Secondary | ICD-10-CM

## 2022-06-20 DIAGNOSIS — R0683 Snoring: Secondary | ICD-10-CM

## 2022-06-20 NOTE — Patient Instructions (Addendum)
Medication Instructions:  Your physician recommends that you continue on your current medications as directed. Please refer to the Current Medication list given to you today.  *If you need a refill on your cardiac medications before your next appointment, please call your pharmacy*   Lab Work: None Ordered If you have labs (blood work) drawn today and your tests are completely normal, you will receive your results only by: [place] (if you have MyChart) OR A paper copy in the mail If you have any lab test that is abnormal or we need to change your treatment, we will call you to review the results.   Testing/Procedures: Your physician has requested that you have an echocardiogram. Echocardiography is a painless test that uses sound waves to create images of your heart. It provides your doctor with information about the size and shape of your heart and how well your heart's chambers and valves are working. This procedure takes approximately one hour. There are no restrictions for this procedure. Please do NOT wear cologne, perfume, aftershave, or lotions (deodorant is allowed). Please arrive 15 minutes prior to your appointment time.    Follow-Up: At [facility], you and your health needs are our priority.  As part of our continuing mission to provide you with exceptional heart care, we have created designated Provider Care Teams.  These Care Teams include your primary Cardiologist (physician) and Advanced Practice Providers (APPs -  Physician Assistants and Nurse Practitioners) who all work together to provide you with the care you need, when you need it.  We recommend signing up for the patient portal called "MyChart".  Sign up information is provided on this After Visit Summary.  MyChart is used to connect with patients for Virtual Visits (Telemedicine).  Patients are able to view lab/test results, encounter notes, upcoming appointments, etc.  Non-urgent messages can be sent to your  provider as well.   To learn more about what you can do with MyChart, go to [email or web address].    Your next appointment:   12 month(s)  The format for your next appointment:   In Person  Provider:   Jenne Campus, MD   Itamar sleep study- will call when insurance authorizes Other Instructions NA

## 2022-06-20 NOTE — Telephone Encounter (Signed)
Rx refill sent to pharmacy. 

## 2022-06-20 NOTE — Progress Notes (Signed)
Cardiology Office Note:    Date:  06/20/2022   ID:  Samuel Dunn, DOB November 23, 1960, MRN [id number]  PCP:  Marco Collie, MD  Cardiologist:  Jenne Campus, MD    Referring MD: Marco Collie, MD   Chief Complaint  Patient presents with   Annual Exam    History of Present Illness:    Samuel Dunn is a 62 y.o. male with past medical history significant for essential hypertension, diabetes, hemophilia, status post splenectomy he was referred to [place] initially because of atypical chest pain, stress test was done which showed questionable area of ischemia after that because of high risk of cardiac catheterization we elected to proceed with coronary CT angio which showed normal coronaries with calcium score 0.  Additional problem include right bundle branch block.  He comes today to months for follow-up overall doing well.  He denies have any chest pain tightness squeezing pressure mid chest describes 1 episode of short palpitations.  Past Medical History:  Diagnosis Date   Abnormal stress test 08/18/2019   Allergic rhinitis    Atypical chest pain 08/03/2019   Autoimmune hepatitis ([place]) 04/09/2011   Benign prostatic hyperplasia without lower urinary tract symptoms    Chronic hepatitis C ([place])    Diabetes mellitus ([place]) 08/03/2019   Dyslipidemia 08/03/2019   ED (erectile dysfunction)    Erythrocytosis 08/03/2019   Hematoma 10/08/2012   Hemophilia ([place]) 08/03/2019   Hemophilia A ([place]) 08/03/2019   Hereditary spherocytosis ([place]) 08/03/2019   Hyperlipidemia 08/03/2019   Hypertension 08/03/2019   Hypogonadism male    Insomnia    Paroxysmal tachycardia, unspecified ([place])    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    citalopram (CELEXA) 10 MG tablet Take 10 mg by mouth daily.   FARXIGA 10 MG TABS tablet Take 10 mg by mouth daily.   fenofibrate 160 MG tablet Take 160 mg by mouth daily.   icosapent Ethyl (VASCEPA) 1 g capsule Take 2 g by mouth 2 (two) times daily.   levocetirizine (XYZAL) 5 MG tablet Take 5 mg by mouth every evening.   metoprolol succinate (TOPROL-XL) 50 MG 24 hr tablet Take 1 tablet (50 mg total) by mouth daily. Pt needs office visit before future refills. 3rd Attempt   nitroGLYCERIN (NITROSTAT) 0.4 MG SL tablet Place 1 tablet (0.4 mg total) under the tongue every 5 (five) minutes as needed for chest pain.   Omega-3 Fatty Acids (FISH OIL) 1000 MG CAPS Take 1 capsule by mouth 2 (two) times daily.   pioglitazone (ACTOS) 45 MG tablet Take 45 mg by mouth daily.   rosuvastatin (CRESTOR) 20 MG tablet Take 20 mg by mouth daily.   sitaGLIPtin-metformin (JANUMET) 50-1000 MG tablet Take 1 tablet by mouth 2 (two) times daily with a meal.   tamsulosin (FLOMAX) 0.4 MG CAPS capsule Take 0.4 mg by mouth daily.   telmisartan (MICARDIS) 80 MG tablet Take 80 mg by mouth daily.   testosterone cypionate (DEPOTESTOSTERONE CYPIONATE) 200 MG/ML injection Inject 200 mg into the muscle once a week.   Vitamin D, Ergocalciferol, (DRISDOL) 1.25 MG (50000 UNIT) CAPS capsule Take 50,000 Units by mouth once a week.     Allergies:  Patient has no known allergies.   Social History   Socioeconomic History   Marital status: Married    Spouse name: Not on file   Number of children: Not on file   Years of education: Not on file   Highest education level: Not on file  Occupational History   Not on file  Tobacco Use   Smoking status: Never   Smokeless tobacco: Never  Substance and Sexual Activity   Alcohol use: Never   Drug use: Not on file   Sexual activity: Not on file  Other Topics Concern   Not on file  Social History Narrative   Not on file   Social Determinants of Health   Financial Resource Strain:  Not on file  Food Insecurity: Not on file  Transportation Needs: Not on file  Physical Activity: Not on file  Stress: Not on file  Social Connections: Not on file     Family History: The patient's family history includes Cancer in his mother; Colon cancer in his sister; Hemophilia in his brother; Liver disease in his father. ROS:   Please see the history of present illness.    All 14 point review of systems negative except as described per history of present illness  EKGs/Labs/Other Studies Reviewed:      Recent Labs: No results found for requested labs within last 365 days.  Recent Lipid Panel No results found for: "CHOL", "TRIG", "HDL", "CHOLHDL", "VLDL", "LDLCALC", "LDLDIRECT"  Physical Exam:    VS:  BP 100/70 (BP Location: Left Arm, Patient Position: Sitting, Cuff Size: Normal)   Pulse 98   Ht 6\' 1"  (1.854 m)   Wt 230 lb (104.3 kg)   SpO2 93%   BMI 30.34 kg/m     Wt Readings from Last 3 Encounters:  06/20/22 230 lb (104.3 kg)  01/26/21 226 lb 9.6 oz (102.8 kg)  04/12/20 234 lb (106.1 kg)     GEN:  Well nourished, well developed in no acute distress HEENT: Normal NECK: No JVD; No carotid bruits LYMPHATICS: No lymphadenopathy CARDIAC: RRR, no murmurs, no rubs, no gallops RESPIRATORY:  Clear to auscultation without rales, wheezing or rhonchi  ABDOMEN: Soft, non-tender, non-distended MUSCULOSKELETAL:  No edema; No deformity  SKIN: Warm and dry LOWER EXTREMITIES: no swelling NEUROLOGIC:  Alert and oriented x 3 PSYCHIATRIC:  Normal affect   ASSESSMENT:    1. SVT (supraventricular tachycardia)   2. Right bundle branch block   3. Dyslipidemia   4. Hereditary spherocytosis ([place])   5. Primary hypertension   6. Obstructive sleep apnea    PLAN:    In order of problems listed above:  Supraventricular tachycardia denies having any frequent episode just 1 episode within the last year.  I told him if became more frequent then we may need to adjust his  medications. Right bundle branch block.  I will schedule him to have another echocardiogram to look at the size of the right ventricle.  I am more about him potentially having sleep apnea sleep study will be scheduled. 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 I suspect strongly he had obstructive sleep apnea he said he snores sometimes stop breathing at night will do sleep study   Medication Adjustments/Labs and Tests Ordered: Current medicines are reviewed at length with the patient today.  Concerns regarding medicines are  outlined above.  No orders of the defined types were placed in this encounter.  Medication changes: No orders of the defined types were placed in this encounter.   Signed, Park Liter, MD, [facility] 06/20/2022 4:31 PM    [facility]

## 2022-06-20 NOTE — Addendum Note (Signed)
Addended by: Jacobo Forest D on: 06/20/2022 04:47 PM   Modules accepted: Orders

## 2022-06-22 ENCOUNTER — Telehealth: Payer: Self-pay | Admitting: *Deleted

## 2022-06-22 NOTE — Telephone Encounter (Signed)
Secure chat message sent to Lisa Harris ok to activate itamar device. 

## 2022-07-03 ENCOUNTER — Ambulatory Visit: Payer: Managed Care, Other (non HMO) | Attending: Cardiology

## 2022-07-03 DIAGNOSIS — R0609 Other forms of dyspnea: Secondary | ICD-10-CM | POA: Diagnosis not present

## 2022-07-03 DIAGNOSIS — I4510 Unspecified right bundle-branch block: Secondary | ICD-10-CM | POA: Diagnosis not present

## 2022-07-03 LAB — ECHOCARDIOGRAM COMPLETE
Area-P 1/2: 4.05 cm2
S' Lateral: 3.7 cm

## 2022-07-06 ENCOUNTER — Telehealth: Payer: Self-pay

## 2022-07-06 NOTE — Telephone Encounter (Signed)
Results reviewed with pt as per Dr. Krasowski's note.  Pt verbalized understanding and had no additional questions. Routed to PCP  

## 2022-07-25 ENCOUNTER — Telehealth (HOSPITAL_BASED_OUTPATIENT_CLINIC_OR_DEPARTMENT_OTHER): Payer: Self-pay

## 2022-07-25 NOTE — Telephone Encounter (Signed)
Call to patient who states he plans on taking the Itamar test. Pt reports he is waiting for a good time to do so.  1st reminder call. Jim Like [facility] RN CCM

## 2022-10-02 ENCOUNTER — Telehealth (HOSPITAL_BASED_OUTPATIENT_CLINIC_OR_DEPARTMENT_OTHER): Payer: Self-pay

## 2022-10-02 NOTE — Telephone Encounter (Signed)
Call to patient requested he return [employer] since he hasn't taken the test and doesn't know when he will be able to due "baby keeping him awake all night." Jim Like [facility] RN CCM

## 2022-11-20 NOTE — Addendum Note (Signed)
Addended by: Baldo Ash D on: 11/20/2022 01:26 PM   Modules accepted: Orders

## 2022-11-20 NOTE — Telephone Encounter (Signed)
**Note De-Identified Breea Loncar Obfuscation** Forwarding this note to Dr Bing Matter for ok to cancel the pts Itamar-HST as the pt has not been able to do the HST and is unsure when he can.

## 2022-11-20 NOTE — Telephone Encounter (Signed)
Cancelled Samuel Dunn Sleep Study as pt does not know when he will be able to complete the test.

## 2022-11-27 NOTE — Telephone Encounter (Signed)
**Note De-Identified Samuel Dunn Obfuscation** Referred to billing as the pt has not returned the Itamar-HST device.

## 2023-04-06 ENCOUNTER — Other Ambulatory Visit: Payer: Self-pay | Admitting: Cardiology

## 2023-06-12 ENCOUNTER — Other Ambulatory Visit: Payer: Self-pay | Admitting: Cardiology

## 2023-06-12 NOTE — Telephone Encounter (Signed)
 Prescription sent to pharmacy.

## 2023-07-23 ENCOUNTER — Other Ambulatory Visit: Payer: Self-pay | Admitting: Cardiology

## 2023-08-20 ENCOUNTER — Other Ambulatory Visit: Payer: Self-pay | Admitting: Cardiology

## 2023-09-20 ENCOUNTER — Other Ambulatory Visit: Payer: Self-pay | Admitting: Cardiology

## 2023-09-24 NOTE — Telephone Encounter (Signed)
 Pt of Dr. Krasowski. Pt has passed his 3rd attempt. Does Dr. Bernie want to refill? Please advise.
